# Patient Record
Sex: Female | Born: 1965 | Race: Black or African American | Hispanic: No | Marital: Single | Smoking: Current every day smoker
Health system: Southern US, Community
[De-identification: ages and names within clinical notes are randomized; demographics above are authoritative.]

## PROBLEM LIST (undated history)

## (undated) DIAGNOSIS — E7800 Pure hypercholesterolemia, unspecified: Secondary | ICD-10-CM

## (undated) HISTORY — PX: TUBAL LIGATION: SHX77

## (undated) HISTORY — DX: Pure hypercholesterolemia, unspecified: E78.00

---

## 1998-01-10 ENCOUNTER — Emergency Department (HOSPITAL_COMMUNITY): Admission: EM | Admit: 1998-01-10 | Discharge: 1998-01-10 | Payer: Self-pay | Admitting: Emergency Medicine

## 1998-01-25 ENCOUNTER — Emergency Department (HOSPITAL_COMMUNITY): Admission: EM | Admit: 1998-01-25 | Discharge: 1998-01-25 | Payer: Self-pay | Admitting: Emergency Medicine

## 1999-09-07 ENCOUNTER — Emergency Department (HOSPITAL_COMMUNITY): Admission: EM | Admit: 1999-09-07 | Discharge: 1999-09-07 | Payer: Self-pay | Admitting: Emergency Medicine

## 2000-03-19 ENCOUNTER — Emergency Department (HOSPITAL_COMMUNITY): Admission: EM | Admit: 2000-03-19 | Discharge: 2000-03-19 | Payer: Self-pay

## 2000-10-24 ENCOUNTER — Emergency Department (HOSPITAL_COMMUNITY): Admission: EM | Admit: 2000-10-24 | Discharge: 2000-10-24 | Payer: Self-pay | Admitting: Emergency Medicine

## 2000-10-24 ENCOUNTER — Encounter: Payer: Self-pay | Admitting: Emergency Medicine

## 2000-11-14 ENCOUNTER — Encounter: Payer: Self-pay | Admitting: *Deleted

## 2000-11-14 ENCOUNTER — Emergency Department (HOSPITAL_COMMUNITY): Admission: EM | Admit: 2000-11-14 | Discharge: 2000-11-14 | Payer: Self-pay | Admitting: *Deleted

## 2002-07-28 ENCOUNTER — Emergency Department (HOSPITAL_COMMUNITY): Admission: EM | Admit: 2002-07-28 | Discharge: 2002-07-28 | Payer: Self-pay | Admitting: Emergency Medicine

## 2003-10-05 ENCOUNTER — Ambulatory Visit (HOSPITAL_COMMUNITY): Admission: RE | Admit: 2003-10-05 | Discharge: 2003-10-05 | Payer: Self-pay | Admitting: Family Medicine

## 2004-03-08 ENCOUNTER — Emergency Department (HOSPITAL_COMMUNITY): Admission: EM | Admit: 2004-03-08 | Discharge: 2004-03-08 | Payer: Self-pay | Admitting: Emergency Medicine

## 2004-12-27 ENCOUNTER — Emergency Department (HOSPITAL_COMMUNITY): Admission: EM | Admit: 2004-12-27 | Discharge: 2004-12-27 | Payer: Self-pay | Admitting: Emergency Medicine

## 2007-06-15 ENCOUNTER — Emergency Department (HOSPITAL_COMMUNITY): Admission: EM | Admit: 2007-06-15 | Discharge: 2007-06-15 | Payer: Self-pay | Admitting: Family Medicine

## 2010-09-20 ENCOUNTER — Emergency Department (HOSPITAL_COMMUNITY)
Admission: EM | Admit: 2010-09-20 | Discharge: 2010-09-20 | Discharge status: Against Medical Advice | Payer: Self-pay | Attending: Emergency Medicine | Admitting: Emergency Medicine

## 2010-09-20 DIAGNOSIS — R079 Chest pain, unspecified: Secondary | ICD-10-CM | POA: Insufficient documentation

## 2011-10-17 ENCOUNTER — Encounter (HOSPITAL_COMMUNITY): Payer: Self-pay | Admitting: *Deleted

## 2011-10-17 ENCOUNTER — Other Ambulatory Visit: Payer: Self-pay

## 2011-10-17 ENCOUNTER — Emergency Department (HOSPITAL_COMMUNITY)
Admission: EM | Admit: 2011-10-17 | Discharge: 2011-10-17 | Disposition: A | Discharge status: Home / Self Care | Payer: Medicaid Other | Attending: Emergency Medicine | Admitting: Emergency Medicine

## 2011-10-17 ENCOUNTER — Emergency Department (HOSPITAL_COMMUNITY): Payer: Medicaid Other

## 2011-10-17 DIAGNOSIS — F17200 Nicotine dependence, unspecified, uncomplicated: Secondary | ICD-10-CM | POA: Insufficient documentation

## 2011-10-17 DIAGNOSIS — R0789 Other chest pain: Secondary | ICD-10-CM

## 2011-10-17 DIAGNOSIS — M940 Chondrocostal junction syndrome [Tietze]: Secondary | ICD-10-CM | POA: Insufficient documentation

## 2011-10-17 DIAGNOSIS — R071 Chest pain on breathing: Secondary | ICD-10-CM | POA: Insufficient documentation

## 2011-10-17 LAB — BASIC METABOLIC PANEL
Calcium: 9.1 mg/dL (ref 8.4–10.5)
Chloride: 100 mEq/L (ref 96–112)
Creatinine, Ser: 0.65 mg/dL (ref 0.50–1.10)
Glucose, Bld: 118 mg/dL — ABNORMAL HIGH (ref 70–99)
Potassium: 3.9 mEq/L (ref 3.5–5.1)
Sodium: 134 mEq/L — ABNORMAL LOW (ref 135–145)

## 2011-10-17 LAB — DIFFERENTIAL
Basophils Relative: 1 % (ref 0–1)
Eosinophils Absolute: 0.1 10*3/uL (ref 0.0–0.7)
Lymphocytes Relative: 44 % (ref 12–46)
Monocytes Absolute: 0.5 10*3/uL (ref 0.1–1.0)

## 2011-10-17 LAB — CBC
HCT: 31.6 % — ABNORMAL LOW (ref 36.0–46.0)
Hemoglobin: 9.7 g/dL — ABNORMAL LOW (ref 12.0–15.0)
Platelets: 296 10*3/uL (ref 150–400)
RBC: 4.63 MIL/uL (ref 3.87–5.11)
RDW: 18.4 % — ABNORMAL HIGH (ref 11.5–15.5)
WBC: 7.3 10*3/uL (ref 4.0–10.5)

## 2011-10-17 LAB — POCT I-STAT TROPONIN I: Troponin i, poc: 0 ng/mL (ref 0.00–0.08)

## 2011-10-17 MED ORDER — IBUPROFEN 600 MG PO TABS: 600.0000 mg | ORAL_TABLET | Freq: Three times a day (TID) | ORAL | Status: AC | PRN

## 2011-10-17 MED ORDER — ASPIRIN 81 MG PO CHEW
324.0000 mg | CHEWABLE_TABLET | Freq: Once | ORAL | Status: AC
  Administered 2011-10-17: 324 mg via ORAL
  Filled 2011-10-17: qty 4

## 2011-10-17 NOTE — ED Notes (Signed)
Patient is AOx4 and comfortable with her discharge instructions. 

## 2011-10-17 NOTE — Discharge Instructions (Signed)
Chest Pain (Nonspecific) It is often hard to give a specific diagnosis for the cause of chest pain. There is always a chance that your pain could be related to something serious, such as a heart attack or a blood clot in the lungs. You need to follow up with your caregiver for further evaluation. CAUSES   Heartburn.   Pneumonia or bronchitis.   Anxiety or stress.   Inflammation around your heart (pericarditis) or lung (pleuritis or pleurisy).   A blood clot in the lung.   A collapsed lung (pneumothorax). It can develop suddenly on its own (spontaneous pneumothorax) or from injury (trauma) to the chest.   Shingles infection (herpes zoster virus).  The chest wall is composed of bones, muscles, and cartilage. Any of these can be the source of the pain.  The bones can be bruised by injury.   The muscles or cartilage can be strained by coughing or overwork.   The cartilage can be affected by inflammation and become sore (costochondritis).  DIAGNOSIS  Lab tests or other studies, such as X-rays, electrocardiography, stress testing, or cardiac imaging, may be needed to find the cause of your pain.  TREATMENT   Treatment depends on what may be causing your chest pain. Treatment may include:   Acid blockers for heartburn.   Anti-inflammatory medicine.   Pain medicine for inflammatory conditions.   Antibiotics if an infection is present.   You may be advised to change lifestyle habits. This includes stopping smoking and avoiding alcohol, caffeine, and chocolate.   You may be advised to keep your head raised (elevated) when sleeping. This reduces the chance of acid going backward from your stomach into your esophagus.   Most of the time, nonspecific chest pain will improve within 2 to 3 days with rest and mild pain medicine.  HOME CARE INSTRUCTIONS   If antibiotics were prescribed, take your antibiotics as directed. Finish them even if you start to feel better.   For the next few  days, avoid physical activities that bring on chest pain. Continue physical activities as directed.   Do not smoke.   Avoid drinking alcohol.   Only take over-the-counter or prescription medicine for pain, discomfort, or fever as directed by your caregiver.   Follow your caregiver's suggestions for further testing if your chest pain does not go away.   Keep any follow-up appointments you made. If you do not go to an appointment, you could develop lasting (chronic) problems with pain. If there is any problem keeping an appointment, you must call to reschedule.  SEEK MEDICAL CARE IF:   You think you are having problems from the medicine you are taking. Read your medicine instructions carefully.   Your chest pain does not go away, even after treatment.   You develop a rash with blisters on your chest.  SEEK IMMEDIATE MEDICAL CARE IF:   You have increased chest pain or pain that spreads to your arm, neck, jaw, back, or abdomen.   You develop shortness of breath, an increasing cough, or you are coughing up blood.   You have severe back or abdominal pain, feel nauseous, or vomit.   You develop severe weakness, fainting, or chills.   You have a fever.  THIS IS AN EMERGENCY. Do not wait to see if the pain will go away. Get medical help at once. Call your local emergency services (911 in [place] Do not drive yourself to the hospital. MAKE SURE YOU:   Understand these instructions.     Will watch your condition.   Will get help right away if you are not doing well or get worse.  Document Released: 04/23/2005 Document Revised: 07/03/2011 Document Reviewed: 02/17/2008 [facility] Patient Information 2012 [place], [place].Chest Wall Pain Chest wall pain is pain in or around the bones and muscles of your chest. It may take up to 6 weeks to get better. It may take longer if you must stay physically active in your work and activities.  CAUSES  Chest wall pain may happen on its own. However, it may  be caused by:  A viral illness like the flu.   Injury.   Coughing.   Exercise.   Arthritis.   Fibromyalgia.   Shingles.  HOME CARE INSTRUCTIONS   Avoid overtiring physical activity. Try not to strain or perform activities that cause pain. This includes any activities using your chest or your abdominal and side muscles, especially if heavy weights are used.   Put ice on the sore area.   Put ice in a plastic bag.   Place a towel between your skin and the bag.   Leave the ice on for 15 to 20 minutes per hour while awake for the first 2 days.   Only take over-the-counter or prescription medicines for pain, discomfort, or fever as directed by your caregiver.  SEEK IMMEDIATE MEDICAL CARE IF:   Your pain increases, or you are very uncomfortable.   You have a fever.   Your chest pain becomes worse.   You have new, unexplained symptoms.   You have nausea or vomiting.   You feel sweaty or lightheaded.   You have a cough with phlegm (sputum), or you cough up blood.  MAKE SURE YOU:   Understand these instructions.   Will watch your condition.   Will get help right away if you are not doing well or get worse.  Document Released: 07/14/2005 Document Revised: 07/03/2011 Document Reviewed: 03/10/2011 [facility] Patient Information 2012 [place], [place].Costochondritis Costochondritis (Tietze syndrome), or costochondral separation, is a swelling and irritation (inflammation) of the tissue (cartilage) that connects your ribs with your breastbone (sternum). It may occur on its own (spontaneously), through damage caused by an accident (trauma), or simply from coughing or minor exercise. It may take up to 6 weeks to get better and longer if you are unable to be conservative in your activities. HOME CARE INSTRUCTIONS   Avoid exhausting physical activity. Try not to strain your ribs during normal activity. This would include any activities using chest, belly (abdominal), and side  muscles, especially if heavy weights are used.   Use ice for 15 to 20 minutes per hour while awake for the first 2 days. Place the ice in a plastic bag, and place a towel between the bag of ice and your skin.   Only take over-the-counter or prescription medicines for pain, discomfort, or fever as directed by your caregiver.  SEEK IMMEDIATE MEDICAL CARE IF:   Your pain increases or you are very uncomfortable.   You have a fever.   You develop difficulty with your breathing.   You cough up blood.   You develop worse chest pains, shortness of breath, sweating, or vomiting.   You develop new, unexplained problems (symptoms).  MAKE SURE YOU:   Understand these instructions.   Will watch your condition.   Will get help right away if you are not doing well or get worse.  Document Released: 04/23/2005 Document Revised: 07/03/2011 Document Reviewed: 03/01/2008 [facility] Patient Information 2012 [place], [place].

## 2011-10-17 NOTE — ED Notes (Signed)
Per EMS - pt from home, c/o left-sided chest pain x2 days, pain increased w/ palpation and movement. EKG unremarkable for EMS - denies shortness of breath, n/v, or dizziness.

## 2011-10-19 NOTE — ED Provider Notes (Signed)
History     CSN: [id number]  Arrival date & time 10/17/11  0030   First MD Initiated Contact with Patient 10/17/11 0123      Chief Complaint  Patient presents with  . Pleurisy    (Consider location/radiation/quality/duration/timing/severity/associated sxs/prior treatment) Patient is a 46 y.o. female presenting with chest pain. The history is provided by the patient.  Chest Pain The chest pain began 2 days ago. Duration of episode(s) is 2 seconds. Chest pain occurs intermittently. The chest pain is resolved. The pain is associated with breathing and coughing (movement through the chest wall, palpation of the chest wall). The severity of the pain is moderate. The quality of the pain is described as aching and sharp (localized to the left costochondral joints and chest wall). The pain does not radiate. Chest pain is worsened by deep breathing (movement through the chest wall, palpation). Pertinent negatives for primary symptoms include no fever, no fatigue, no syncope, no shortness of breath, no cough, no wheezing, no palpitations, no abdominal pain, no nausea, no vomiting, no dizziness and no altered mental status.  Pertinent negatives for associated symptoms include no claudication, no diaphoresis, no lower extremity edema, no near-syncope, no numbness, no orthopnea, no paroxysmal nocturnal dyspnea and no weakness. She tried NSAIDs for the symptoms. Risk factors include no known risk factors.     History reviewed. No pertinent past medical history.  No past surgical history on file.  No family history on file.  History  Substance Use Topics  . Smoking status: Current Everyday Smoker  . Smokeless tobacco: Not on file  . Alcohol Use:     OB History    Grav Para Term Preterm Abortions TAB SAB Ect Mult Living                  Review of Systems  Constitutional: Negative for fever, chills, diaphoresis, activity change, appetite change and fatigue.  HENT: Negative for congestion,  facial swelling, rhinorrhea, neck pain, neck stiffness and postnasal drip.   Eyes: Negative.   Respiratory: Negative for cough, choking, chest tightness, shortness of breath and wheezing.        Dyspnea on exertion  Cardiovascular: Positive for chest pain. Negative for palpitations, orthopnea, claudication, leg swelling, syncope and near-syncope.  Gastrointestinal: Negative for nausea, vomiting, abdominal pain and abdominal distention.  Musculoskeletal: Negative.   Skin: Negative.   Neurological: Negative for dizziness, syncope, speech difficulty, weakness, light-headedness, numbness and headaches.  Hematological: Does not bruise/bleed easily.  Psychiatric/Behavioral: Negative.  Negative for altered mental status.    Allergies  Review of patient's allergies indicates no known allergies.  Home Medications   Current Outpatient Rx  Name Route Sig Dispense Refill  . IBUPROFEN 200 MG PO TABS Oral Take 400 mg by mouth every 6 (six) hours as needed. For pain    . IBUPROFEN 600 MG PO TABS Oral Take 1 tablet (600 mg total) by mouth every 8 (eight) hours as needed for pain. 20 tablet 0    BP 120/75  Pulse 92  Temp(Src) 98.1 F (36.7 C) (Oral)  Resp 18  Ht 5\' 4"  (1.626 m)  Wt 120 lb (54.432 kg)  BMI 20.60 kg/m2  SpO2 100%  Physical Exam  Nursing note and vitals reviewed. Constitutional: She is oriented to person, place, and time. She appears well-nourished. No distress.  HENT:  Head: Normocephalic and atraumatic.  Mouth/Throat: Oropharynx is clear and moist.  Eyes: EOM are normal. Pupils are equal, round, and reactive to light.  Neck: Normal range of motion. Neck supple. No JVD present. No tracheal deviation present.  Cardiovascular: Normal rate, regular rhythm, S1 normal, S2 normal, normal heart sounds and intact distal pulses.   No extrasystoles are present. PMI is not displaced.  Exam reveals no gallop and no friction rub.   No murmur heard. Pulmonary/Chest: Effort normal and  breath sounds normal. No accessory muscle usage or stridor. Not tachypneic. No respiratory distress. She has no decreased breath sounds. She has no wheezes. She has no rhonchi. She has no rales. She exhibits tenderness and bony tenderness. She exhibits no crepitus and no retraction.       Pain reproduced with palpation of the left constochondral joints and chest wall  Abdominal: Soft. Bowel sounds are normal. She exhibits no distension and no mass. There is no tenderness. There is no rebound and no guarding.  Musculoskeletal: Normal range of motion. She exhibits no edema and no tenderness.  Neurological: She is alert and oriented to person, place, and time. No cranial nerve deficit. She exhibits normal muscle tone.  Skin: Skin is warm and dry. No rash noted. She is not diaphoretic. No erythema. No pallor.  Psychiatric: She has a normal mood and affect. Her behavior is normal. Judgment and thought content normal.    ED Course  Procedures (including critical care time)   Date: 10/19/2011  Rate: 103  Rhythm: sinus tachycardia  QRS Axis: normal  Intervals: normal  ST/T Wave abnormalities: nonspecific T wave changes  Conduction Disutrbances:none  Narrative Interpretation: anterior t wave inversions without prior comparison available  Old EKG Reviewed: none available  While the above ECG could be concerning in the setting of symptoms typical for myocardial ischemia, or with risk factors for CAD, or with prior ECG comparison showing these to be new changes, in this patient the findings are non-specific  Labs Reviewed  CBC - Abnormal; Notable for the following:    Hemoglobin 9.7 (*)    HCT 31.6 (*)    MCV 68.3 (*)    MCH 21.0 (*)    RDW 18.4 (*)    All other components within normal limits  BASIC METABOLIC PANEL - Abnormal; Notable for the following:    Sodium 134 (*)    Glucose, Bld 118 (*)    BUN 5 (*)    All other components within normal limits  DIFFERENTIAL  POCT I-STAT TROPONIN I    LAB REPORT - SCANNED   No results found.   1. Chest wall pain   2. Costochondritis       MDM  The patient's symptoms, history, and physical examination strongly suggest costochondritis and musculoskeletal chest wall pain.  MI, ACS, UA, CAD, GERD, Gastrointestinal Chest Pain, Pleuritic Chest Pain, Pneumonia, Pneumothorax, Pulmonary Embolism, Esophageal Spasm, Arrhythmia considered among other potential etiologies in the patient's differential diagnosis but thought less likely.         Felisa Bonier, MD 10/19/11 [phone]

## 2012-07-08 ENCOUNTER — Encounter (HOSPITAL_COMMUNITY): Payer: Self-pay | Admitting: Emergency Medicine

## 2012-07-08 ENCOUNTER — Emergency Department (HOSPITAL_COMMUNITY)
Admission: EM | Admit: 2012-07-08 | Discharge: 2012-07-08 | Disposition: A | Discharge status: Home / Self Care | Payer: Medicaid Other | Attending: Emergency Medicine | Admitting: Emergency Medicine

## 2012-07-08 DIAGNOSIS — R109 Unspecified abdominal pain: Secondary | ICD-10-CM | POA: Insufficient documentation

## 2012-07-08 DIAGNOSIS — N898 Other specified noninflammatory disorders of vagina: Secondary | ICD-10-CM | POA: Insufficient documentation

## 2012-07-08 DIAGNOSIS — F17200 Nicotine dependence, unspecified, uncomplicated: Secondary | ICD-10-CM | POA: Insufficient documentation

## 2012-07-08 LAB — URINALYSIS, ROUTINE W REFLEX MICROSCOPIC
Nitrite: NEGATIVE
pH: 5 (ref 5.0–8.0)

## 2012-07-08 LAB — WET PREP, GENITAL
Clue Cells Wet Prep HPF POC: NONE SEEN
WBC, Wet Prep HPF POC: NONE SEEN
Yeast Wet Prep HPF POC: NONE SEEN

## 2012-07-08 LAB — HIV ANTIBODY (ROUTINE TESTING W REFLEX): HIV: NONREACTIVE

## 2012-07-08 NOTE — ED Provider Notes (Signed)
History     CSN: [id number]  Arrival date & time 07/08/12  7829   First MD Initiated Contact with Patient 07/08/12 [phone]      Chief Complaint  Patient presents with  . Vaginitis    (Consider location/radiation/quality/duration/timing/severity/associated sxs/prior treatment) HPI  Katherine Werner is a 46 y.o. female c/o dysuria, foul smelling vaginal discharge, with suprapubic abdominal discomfort, cramping, 1/10 fleeting, itching to perineum x4 days., Denies fever, rash, urinary frequency, dyspareunia N/V, change in bowel habits.   History reviewed. No pertinent past medical history.  History reviewed. No pertinent past surgical history.  No family history on file.  History  Substance Use Topics  . Smoking status: Current Every Day Smoker  . Smokeless tobacco: Not on file  . Alcohol Use: Yes    OB History    Grav Para Term Preterm Abortions TAB SAB Ect Mult Living                  Review of Systems  Constitutional: Negative for fever.  Respiratory: Negative for shortness of breath.   Cardiovascular: Negative for chest pain.  Gastrointestinal: Negative for nausea, vomiting, abdominal pain and diarrhea.  Genitourinary: Positive for vaginal discharge.  All other systems reviewed and are negative.    Allergies  Review of patient's allergies indicates no known allergies.  Home Medications   Current Outpatient Rx  Name  Route  Sig  Dispense  Refill  . IBUPROFEN 200 MG PO TABS   Oral   Take 400 mg by mouth every 6 (six) hours as needed. For pain           BP 151/85  Pulse 78  Temp 98 F (36.7 C) (Oral)  Resp 18  Ht 5\' 4"  (1.626 m)  Wt 120 lb (54.432 kg)  BMI 20.60 kg/m2  SpO2 100%  Physical Exam  Nursing note and vitals reviewed. Constitutional: She is oriented to person, place, and time. She appears well-developed and well-nourished. No distress.  HENT:  Head: Normocephalic.  Mouth/Throat: Oropharynx is clear and moist.  Eyes: Conjunctivae normal  and EOM are normal. Pupils are equal, round, and reactive to light.  Cardiovascular: Normal rate, regular rhythm and intact distal pulses.   Pulmonary/Chest: Effort normal and breath sounds normal. No stridor. No respiratory distress. She has no wheezes. She has no rales. She exhibits no tenderness.  Abdominal: Soft. Bowel sounds are normal. She exhibits no distension and no mass. There is no tenderness. There is no rebound and no guarding.  Genitourinary:       Pelvic exam chaperoned by RN.  No rashes, Thin white discharge, no foul order, no cervical motion tenderness or adnexal tenderness.  Musculoskeletal: Normal range of motion.  Neurological: She is alert and oriented to person, place, and time.  Psychiatric: She has a normal mood and affect.    ED Course  Procedures (including critical care time)  Labs Reviewed  URINALYSIS, ROUTINE W REFLEX MICROSCOPIC - Abnormal; Notable for the following:    Color, Urine STRAW (*)     All other components within normal limits  WET PREP, GENITAL  RPR  GC/CHLAMYDIA PROBE AMP  HIV ANTIBODY (ROUTINE TESTING)   No results found.   1. Vaginal discharge       MDM  Physical exam shows no abnormalities, wet prep also normal urinalysis negative. Reassured patient. Konrad Dolores follow with [facility]  Discussed case with attending who agrees with plan and stability to d/c to home.    Pt  verbalized understanding and agrees with care plan. Outpatient follow-up and return precautions given.           Wynetta Emery, PA-C 07/08/12 1024

## 2012-07-08 NOTE — ED Provider Notes (Signed)
This was a supervised encounter with a mid-level provider.  I was available throughout the encounter the  Gerhard Munch, MD 07/08/12 1601

## 2012-07-08 NOTE — ED Notes (Signed)
C/o vaginal discharge with foul odor and abd cramping, denies V/D, dysuria, no fever, no meds pta, NAD

## 2012-07-09 LAB — GC/CHLAMYDIA PROBE AMP
CT Probe RNA: NEGATIVE
GC Probe RNA: NEGATIVE

## 2013-04-20 ENCOUNTER — Other Ambulatory Visit (HOSPITAL_COMMUNITY): Payer: Self-pay | Admitting: *Deleted

## 2013-04-20 DIAGNOSIS — Z1231 Encounter for screening mammogram for malignant neoplasm of breast: Secondary | ICD-10-CM

## 2013-05-10 ENCOUNTER — Ambulatory Visit (HOSPITAL_COMMUNITY)
Admission: RE | Admit: 2013-05-10 | Discharge: 2013-05-10 | Disposition: A | Discharge status: Home / Self Care | Payer: Medicaid Other | Source: Ambulatory Visit | Attending: *Deleted | Admitting: *Deleted

## 2013-05-10 DIAGNOSIS — Z1231 Encounter for screening mammogram for malignant neoplasm of breast: Secondary | ICD-10-CM

## 2013-05-17 ENCOUNTER — Other Ambulatory Visit: Payer: Self-pay | Admitting: *Deleted

## 2013-05-17 DIAGNOSIS — R928 Other abnormal and inconclusive findings on diagnostic imaging of breast: Secondary | ICD-10-CM

## 2013-06-01 ENCOUNTER — Ambulatory Visit
Admission: RE | Admit: 2013-06-01 | Discharge: 2013-06-01 | Disposition: A | Discharge status: Home / Self Care | Payer: Medicaid Other | Source: Ambulatory Visit | Attending: *Deleted | Admitting: *Deleted

## 2013-06-01 DIAGNOSIS — R928 Other abnormal and inconclusive findings on diagnostic imaging of breast: Secondary | ICD-10-CM

## 2013-12-27 ENCOUNTER — Other Ambulatory Visit: Payer: Self-pay | Admitting: Nurse Practitioner

## 2013-12-27 DIAGNOSIS — N63 Unspecified lump in unspecified breast: Secondary | ICD-10-CM

## 2014-01-09 ENCOUNTER — Ambulatory Visit
Admission: RE | Admit: 2014-01-09 | Discharge: 2014-01-09 | Disposition: A | Discharge status: Home / Self Care | Payer: Medicaid Other | Source: Ambulatory Visit | Attending: Nurse Practitioner | Admitting: Nurse Practitioner

## 2014-01-09 ENCOUNTER — Ambulatory Visit
Admission: RE | Admit: 2014-01-09 | Discharge: 2014-01-09 | Disposition: A | Discharge status: Home / Self Care | Payer: Self-pay | Source: Ambulatory Visit | Attending: Nurse Practitioner | Admitting: Nurse Practitioner

## 2014-01-09 DIAGNOSIS — N63 Unspecified lump in unspecified breast: Secondary | ICD-10-CM

## 2014-11-29 ENCOUNTER — Encounter (HOSPITAL_COMMUNITY): Payer: Self-pay | Admitting: Family Medicine

## 2014-11-29 ENCOUNTER — Emergency Department (HOSPITAL_COMMUNITY): Payer: Medicaid Other

## 2014-11-29 ENCOUNTER — Emergency Department (HOSPITAL_COMMUNITY)
Admission: EM | Admit: 2014-11-29 | Discharge: 2014-11-29 | Disposition: A | Discharge status: Home / Self Care | Payer: Medicaid Other | Attending: Emergency Medicine | Admitting: Emergency Medicine

## 2014-11-29 DIAGNOSIS — IMO0001 Reserved for inherently not codable concepts without codable children: Secondary | ICD-10-CM

## 2014-11-29 DIAGNOSIS — R141 Gas pain: Secondary | ICD-10-CM | POA: Insufficient documentation

## 2014-11-29 DIAGNOSIS — R109 Unspecified abdominal pain: Secondary | ICD-10-CM | POA: Insufficient documentation

## 2014-11-29 DIAGNOSIS — R140 Abdominal distension (gaseous): Secondary | ICD-10-CM | POA: Insufficient documentation

## 2014-11-29 DIAGNOSIS — M549 Dorsalgia, unspecified: Secondary | ICD-10-CM | POA: Insufficient documentation

## 2014-11-29 DIAGNOSIS — Z9851 Tubal ligation status: Secondary | ICD-10-CM | POA: Insufficient documentation

## 2014-11-29 DIAGNOSIS — Z3202 Encounter for pregnancy test, result negative: Secondary | ICD-10-CM | POA: Insufficient documentation

## 2014-11-29 DIAGNOSIS — Z720 Tobacco use: Secondary | ICD-10-CM | POA: Insufficient documentation

## 2014-11-29 LAB — URINALYSIS, ROUTINE W REFLEX MICROSCOPIC
Bilirubin Urine: NEGATIVE
GLUCOSE, UA: NEGATIVE mg/dL
Hgb urine dipstick: NEGATIVE
KETONES UR: NEGATIVE mg/dL
LEUKOCYTES UA: NEGATIVE
Nitrite: NEGATIVE
Protein, ur: NEGATIVE mg/dL
Specific Gravity, Urine: <1.005 — ABNORMAL LOW (ref 1.005–1.030)
Urobilinogen, UA: 0.2 mg/dL (ref 0.0–1.0)
pH: 6 (ref 5.0–8.0)

## 2014-11-29 LAB — POC URINE PREG, ED: Preg Test, Ur: NEGATIVE

## 2014-11-29 MED ORDER — DICYCLOMINE HCL 20 MG PO TABS: 20.0000 mg | ORAL_TABLET | Freq: Two times a day (BID) | ORAL | Status: DC

## 2014-11-29 MED ORDER — DICYCLOMINE HCL 10 MG/ML IM SOLN
20.0000 mg | Freq: Once | INTRAMUSCULAR | Status: AC
  Administered 2014-11-29: 20 mg via INTRAMUSCULAR
  Filled 2014-11-29: qty 2

## 2014-11-29 MED ORDER — OMEPRAZOLE 20 MG PO CPDR: 20.0000 mg | DELAYED_RELEASE_CAPSULE | Freq: Every day | ORAL | Status: DC

## 2014-11-29 MED ORDER — GI COCKTAIL ~~LOC~~
30.0000 mL | Freq: Once | ORAL | Status: AC
  Administered 2014-11-29: 30 mL via ORAL
  Filled 2014-11-29: qty 30

## 2014-11-29 MED ORDER — KETOROLAC TROMETHAMINE 60 MG/2ML IM SOLN
60.0000 mg | Freq: Once | INTRAMUSCULAR | Status: AC
  Administered 2014-11-29: 60 mg via INTRAMUSCULAR
  Filled 2014-11-29: qty 2

## 2014-11-29 MED ORDER — MELOXICAM 7.5 MG PO TABS: 7.5000 mg | ORAL_TABLET | Freq: Every day | ORAL | Status: DC

## 2014-11-29 NOTE — ED Notes (Signed)
Pt here for 2 to 3 days of abd pain, headaches, and other multiple complaints.

## 2014-11-29 NOTE — ED Provider Notes (Signed)
CSN: [id number]     Arrival date & time 11/29/14  0251 History  This chart was scribed for Benen Weida, MD by Annye AsaAnna Dorsett, ED Scribe. This patient was seen in room A10C/A10C and the patient's care was started at 3:35 AM.    Chief Complaint  Patient presents with  . Abdominal Pain   Patient is a 49 y.o. female presenting with abdominal pain. The history is provided by the patient. No language interpreter was used.  Abdominal Pain Pain location:  Generalized Pain quality: bloating   Pain radiates to:  Does not radiate Pain severity:  Moderate Onset quality:  Gradual Duration:  3 days Timing:  Constant Progression:  Unchanged Chronicity:  New Relieved by:  None tried Worsened by:  Nothing tried Ineffective treatments:  None tried Associated symptoms: no chest pain, no constipation, no cough, no diarrhea, no dysuria, no fever, no hematuria, no nausea and no vomiting   Risk factors: not pregnant     HPI Comments: Katherine Werner is an otherwise healthy 49 y.o. female who presents to the Emergency Department complaining of 2-3 days of back pain, abdominal pain and distension. Patient also reports limited range of motion to her right arm. She reports her last BM was this morning. She denies any further complaints at this time.   Patient has a PCP but is unable to recall his or her name at present.   History reviewed. No pertinent past medical history. Past Surgical History  Procedure Laterality Date  . Tubal ligation     History reviewed. No pertinent family history. History  Substance Use Topics  . Smoking status: Current Every Day Smoker  . Smokeless tobacco: Not on file  . Alcohol Use: Yes   OB History    No data available     Review of Systems  Constitutional: Negative for fever.  Respiratory: Negative for cough.   Cardiovascular: Negative for chest pain.  Gastrointestinal: Positive for abdominal pain and abdominal distention. Negative for nausea, vomiting, diarrhea and  constipation.  Genitourinary: Negative for dysuria and hematuria.  Musculoskeletal: Positive for back pain.  All other systems reviewed and are negative.   Allergies  Review of patient's allergies indicates no known allergies.  Home Medications   Prior to Admission medications   Medication Sig Start Date End Date Taking? Authorizing Provider  ibuprofen (ADVIL,MOTRIN) 200 MG tablet Take 400 mg by mouth every 6 (six) hours as needed. For pain    Historical Provider, MD   BP 138/79 mmHg  Pulse 73  Temp(Src) 97.7 F (36.5 C) (Oral)  Resp 19  Ht 5\' 2"  (1.575 m)  Wt 132 lb (59.875 kg)  BMI 24.14 kg/m2  SpO2 100%  LMP 04/11/2013 Physical Exam  Constitutional: She is oriented to person, place, and time. She appears well-developed and well-nourished. No distress.  HENT:  Head: Normocephalic and atraumatic.  Mouth/Throat: Oropharynx is clear and moist. No oropharyngeal exudate.  Moist mucous membranes  Eyes: EOM are normal. Pupils are equal, round, and reactive to light.  Neck: Normal range of motion. Neck supple. No JVD present.  No lymphnodes in the neck, no supraclavicular lymphnodes.   Cardiovascular: Normal rate, regular rhythm and normal heart sounds.  Exam reveals no gallop and no friction rub.   No murmur heard. Pulmonary/Chest: Effort normal and breath sounds normal. No respiratory distress. She has no wheezes. She has no rales.  Abdominal: Soft. Bowel sounds are normal. She exhibits no mass. There is no tenderness. There is no rebound  and no guarding.  Gassy  Musculoskeletal: Normal range of motion. She exhibits no edema.  Moves all extremities normally. No lymphnodes in the groin.   Lymphadenopathy:    She has no cervical adenopathy.  Neurological: She is alert and oriented to person, place, and time. She displays normal reflexes.  Skin: Skin is warm and dry. No rash noted.  Psychiatric: She has a normal mood and affect. Her behavior is normal.  Nursing note and vitals  reviewed.   ED Course  Procedures   DIAGNOSTIC STUDIES: Oxygen Saturation is 100% on RA, normal by my interpretation.    COORDINATION OF CARE: 3:40 AM Discussed treatment plan with pt at bedside and pt agreed to plan.   Labs Review Labs Reviewed  URINALYSIS, ROUTINE W REFLEX MICROSCOPIC  POC URINE PREG, ED    Imaging Review No results found.   EKG Interpretation None      MDM   Final diagnoses:  None   Gas on exam and associated cramping.  Exam and vitals are benign and reassuring.  No indication for labs or advanced imaging will treat symptomatically   I personally performed the services described in this documentation, which was scribed in my presence. The recorded information has been reviewed and is accurate.       Cy BlamerApril Manvi Guilliams, MD 11/29/14 [phone]

## 2014-11-29 NOTE — ED Notes (Signed)
Patient transported to X-ray 

## 2014-11-29 NOTE — ED Notes (Signed)
Pt asking for ginger ale

## 2016-06-24 ENCOUNTER — Telehealth (HOSPITAL_COMMUNITY): Payer: Self-pay | Admitting: *Deleted

## 2016-06-24 NOTE — Telephone Encounter (Signed)
Attempted to call patient to schedule appointment with [facility]. Left message for patient to call me back.

## 2016-08-27 ENCOUNTER — Emergency Department (HOSPITAL_COMMUNITY): Payer: Self-pay

## 2016-08-27 ENCOUNTER — Encounter (HOSPITAL_COMMUNITY): Payer: Self-pay

## 2016-08-27 ENCOUNTER — Emergency Department (HOSPITAL_COMMUNITY)
Admission: EM | Admit: 2016-08-27 | Discharge: 2016-08-27 | Disposition: A | Discharge status: Home / Self Care | Payer: Self-pay | Attending: Emergency Medicine | Admitting: Emergency Medicine

## 2016-08-27 DIAGNOSIS — W010XXA Fall on same level from slipping, tripping and stumbling without subsequent striking against object, initial encounter: Secondary | ICD-10-CM | POA: Insufficient documentation

## 2016-08-27 DIAGNOSIS — Z23 Encounter for immunization: Secondary | ICD-10-CM | POA: Insufficient documentation

## 2016-08-27 DIAGNOSIS — Y92009 Unspecified place in unspecified non-institutional (private) residence as the place of occurrence of the external cause: Secondary | ICD-10-CM | POA: Insufficient documentation

## 2016-08-27 DIAGNOSIS — Y999 Unspecified external cause status: Secondary | ICD-10-CM | POA: Insufficient documentation

## 2016-08-27 DIAGNOSIS — F17200 Nicotine dependence, unspecified, uncomplicated: Secondary | ICD-10-CM | POA: Insufficient documentation

## 2016-08-27 DIAGNOSIS — S81812A Laceration without foreign body, left lower leg, initial encounter: Secondary | ICD-10-CM | POA: Insufficient documentation

## 2016-08-27 DIAGNOSIS — Y9389 Activity, other specified: Secondary | ICD-10-CM | POA: Insufficient documentation

## 2016-08-27 DIAGNOSIS — S8991XA Unspecified injury of right lower leg, initial encounter: Secondary | ICD-10-CM | POA: Insufficient documentation

## 2016-08-27 MED ORDER — LIDOCAINE HCL (PF) 1 % IJ SOLN
5.0000 mL | Freq: Once | INTRAMUSCULAR | Status: AC
  Administered 2016-08-27: 5 mL
  Filled 2016-08-27: qty 5

## 2016-08-27 MED ORDER — TRAMADOL HCL 50 MG PO TABS: 50.0000 mg | ORAL_TABLET | Freq: Four times a day (QID) | ORAL | 0 refills | Status: DC | PRN

## 2016-08-27 MED ORDER — TETANUS-DIPHTH-ACELL PERTUSSIS 5-2.5-18.5 LF-MCG/0.5 IM SUSP
0.5000 mL | Freq: Once | INTRAMUSCULAR | Status: AC
  Administered 2016-08-27: 0.5 mL via INTRAMUSCULAR
  Filled 2016-08-27: qty 0.5

## 2016-08-27 MED ORDER — DICLOFENAC SODIUM 50 MG PO TBEC: 50.0000 mg | DELAYED_RELEASE_TABLET | Freq: Two times a day (BID) | ORAL | 0 refills | Status: DC

## 2016-08-27 MED ORDER — HYDROCODONE-ACETAMINOPHEN 5-325 MG PO TABS
1.0000 | ORAL_TABLET | Freq: Once | ORAL | Status: AC
  Administered 2016-08-27: 1 via ORAL
  Filled 2016-08-27: qty 1

## 2016-08-27 NOTE — ED Triage Notes (Signed)
Pt states slipped and fell, struck L lower leg on railing. Pt with skin tear to L anterior lower leg. Pt ambulatory at triage, full ROM. Bleeding controlled at triage.

## 2016-08-27 NOTE — ED Notes (Signed)
PA at bedside.

## 2016-08-27 NOTE — Progress Notes (Signed)
Orthopedic Tech Progress Note Patient Details:  Katherine PufferDana M Werner 08/30/1965 [id number]  Ortho Devices Type of Ortho Device: Crutches, Knee Immobilizer Ortho Device/Splint Location: RLE Ortho Device/Splint Interventions: Ordered, Application   Jennye MoccasinHughes, Jory Welke Craig 08/27/2016, 6:40 PM

## 2016-08-27 NOTE — ED Provider Notes (Signed)
[facility]-EMERGENCY DEPT Provider Note   CSN: [id number] Arrival date & time: 08/27/16  1514  By signing my name below, I, Nelwyn Salisbury, attest that this documentation has been prepared under the direction and in the presence of non-physician practitioner, Kerrie Buffalo, NP. Electronically Signed: Nelwyn Salisbury, Scribe. 08/27/2016. 4:12 PM.  History   Chief Complaint Chief Complaint  Patient presents with  . Leg Injury   The history is provided by the patient. No language interpreter was used.  Laceration   The incident occurred 1 to 2 hours ago. The laceration is located on the left leg. The laceration is 6 cm in size. The laceration mechanism was a a blunt object. The pain is mild. The pain has been constant since onset. She reports no foreign bodies present. Her tetanus status is out of date.    HPI Comments:  Katherine Werner is an otherwise healthy 51 y.o. female who presents to the Emergency Department complaining of constant, unchanged left leg laceration she sustained earlier today. Pt states she was helping her daughter move laundry when she slipped, fell, and cut her leg. She reports associated right knee pain and swelling. Pt has not tried anything at home for her pain. She denies any weakness or numbness. Pt is not UTD on tetanus.   History reviewed. No pertinent past medical history.  There are no active problems to display for this patient.   Past Surgical History:  Procedure Laterality Date  . TUBAL LIGATION      OB History    No data available       Home Medications    Prior to Admission medications   Medication Sig Start Date End Date Taking? Authorizing Provider  cetirizine (ZYRTEC) 10 MG tablet Take 10 mg by mouth daily as needed for allergies.    Historical Provider, MD  diclofenac (VOLTAREN) 50 MG EC tablet Take 1 tablet (50 mg total) by mouth 2 (two) times daily. 08/27/16   Hope Orlene Och, NP  dicyclomine (BENTYL) 20 MG tablet Take 1 tablet (20 mg total) by mouth 2  (two) times daily. 11/29/14   April Palumbo, MD  ibuprofen (ADVIL,MOTRIN) 200 MG tablet Take 400 mg by mouth every 6 (six) hours as needed. For pain    Historical Provider, MD  meloxicam (MOBIC) 7.5 MG tablet Take 1 tablet (7.5 mg total) by mouth daily. 11/29/14   April Palumbo, MD  omeprazole (PRILOSEC) 20 MG capsule Take 1 capsule (20 mg total) by mouth daily. 11/29/14   April Palumbo, MD  traMADol (ULTRAM) 50 MG tablet Take 1 tablet (50 mg total) by mouth every 6 (six) hours as needed. 08/27/16   Hope Orlene Och, NP    Family History History reviewed. No pertinent family history.  Social History Social History  Substance Use Topics  . Smoking status: Current Every Day Smoker  . Smokeless tobacco: Never Used  . Alcohol use Yes     Allergies   Patient has no known allergies.   Review of Systems Review of Systems  Gastrointestinal: Negative for abdominal pain.  Musculoskeletal: Positive for arthralgias and joint swelling.  Skin: Positive for wound.  Neurological: Negative for weakness and numbness.     Physical Exam Updated Vital Signs BP 145/79 (BP Location: Right Arm)   Pulse 92   Temp 97.8 F (36.6 C) (Oral)   Resp 18   LMP 04/11/2013   SpO2 100%   Physical Exam  Constitutional: She is oriented to person, place, and time. She appears  well-developed and well-nourished. No distress.  HENT:  Head: Normocephalic and atraumatic.  Eyes: Conjunctivae are normal.  Neck: Neck supple.  Cardiovascular: Normal rate.   Pulmonary/Chest: Effort normal.  Musculoskeletal:       Right knee: She exhibits no ecchymosis, no deformity, no laceration, no erythema, normal alignment and normal patellar mobility. Decreased range of motion: due to pain. Swelling: mild. Tenderness found.       Left lower leg: She exhibits tenderness, bony tenderness and laceration.       Legs: She has tenderness on palpation to the lower leg. She has slight swelling to the right knee and tenderness with palpation  and ROM. Pedal pulses 2+.   Neurological: She is alert and oriented to person, place, and time.  Skin: Skin is warm and dry.  6cm laceration to the anterior left lower leg.   Psychiatric: She has a normal mood and affect.  Nursing note and vitals reviewed.    ED Treatments / Results  DIAGNOSTIC STUDIES:  Oxygen Saturation is 100% on RA, normal by my interpretation.    COORDINATION OF CARE:  4:16 PM Discussed treatment plan with pt at bedside which includes X-ray and laceration repair and pt agreed to plan.   Radiology Dg Tibia/fibula Left  Result Date: 08/27/2016 CLINICAL DATA:  Larey SeatFell today while moving furniture, landed on RIGHT knee, injured and lacerated lower anterior LEFT leg EXAM: LEFT TIBIA AND FIBULA - 2 VIEW COMPARISON:  None FINDINGS: Osseous demineralization. Knee and ankle joint alignments normal. No acute fracture, dislocation, or bone destruction. Soft tissues radiographically unremarkable. IMPRESSION: Normal exam. Electronically Signed   By: Ulyses SouthwardMark  Boles M.D.   On: 08/27/2016 17:52   Dg Knee Complete 4 Views Right  Result Date: 08/27/2016 CLINICAL DATA:  Anterior RIGHT knee pain after falling today while moving furniture, landed on RIGHT knee, injury lacerated lower anterior LEFT leg EXAM: RIGHT KNEE - COMPLETE 4+ VIEW COMPARISON:  None FINDINGS: Osseous mineralization appears mildly decreased diffusely. Joint spaces preserved. Age-indeterminate avulsion fracture identified at medial margin of medial femoral condyle, question reflecting MCL injury. No additional fracture, dislocation, or bone destruction. Sclerotic focus within proximal RIGHT tibial diaphysis question bone infarct or enchondroma. No definite knee joint effusion. IMPRESSION: Age-indeterminate avulsion fragment at the medial margin of the medial femoral condyle question MCL injury. No additional acute bony abnormalities. Question enchondroma versus bone infarct at proximal RIGHT tibial diaphysis. Electronically  Signed   By: Ulyses SouthwardMark  Boles M.D.   On: 08/27/2016 17:52    Procedures .Marland Kitchen.Laceration Repair Date/Time: 08/27/2016 5:14 PM Performed by: Janne NapoleonNEESE, HOPE M Authorized by: Janne NapoleonNEESE, HOPE M   Consent:    Consent obtained:  Verbal   Consent given by:  Patient   Risks discussed:  Infection and poor cosmetic result   Alternatives discussed:  No treatment Anesthesia (see MAR for exact dosages):    Anesthesia method:  Local infiltration   Local anesthetic:  Lidocaine 1% w/o epi Laceration details:    Location:  Leg   Leg location:  L lower leg   Length (cm):  6 Repair type:    Repair type:  Intermediate Exploration:    Hemostasis achieved with:  Direct pressure   Wound extent: no foreign bodies/material noted, no nerve damage noted, no tendon damage noted and no underlying fracture noted     Contaminated: no   Treatment:    Area cleansed with:  Betadine (cleaned with betadine scrub brush and irrigated with NSS prior to procedure)   Amount of cleaning:  Extensive   Irrigation solution:  Sterile saline   Irrigation method:  Syringe Subcutaneous repair:    Suture size:  4-0   Suture material:  Vicryl   Number of sutures:  2 Skin repair:    Repair method:  Sutures   Suture size:  4-0   Suture material:  Prolene   Suture technique:  Simple interrupted   Number of sutures:  8 Approximation:    Approximation:  Loose   Vermilion border: well-aligned   Post-procedure details:    Dressing:  Sterile dressing and non-adherent dressing   Patient tolerance of procedure:  Tolerated well, no immediate complications Comments:     Tetanus updated.    (including critical care time)  Medications Ordered in ED Medications  Tdap (BOOSTRIX) injection 0.5 mL (0.5 mLs Intramuscular Given 08/27/16 1632)  lidocaine (PF) (XYLOCAINE) 1 % injection 5 mL (5 mLs Infiltration Given 08/27/16 1632)  HYDROcodone-acetaminophen (NORCO/VICODIN) 5-325 MG per tablet 1 tablet (1 tablet Oral Given 08/27/16 1639)      Initial Impression / Assessment and Plan / ED Course  I have reviewed the triage vital signs and the nursing notes.  Pertinent imaging results that were available during my care of the patient were reviewed by me and considered in my medical decision making (see chart for details).     Final Clinical Impressions(s) / ED Diagnoses  51 y.o. female with laceration to the left lower leg and contusion to the right knee stable for d/c without focal neuro deficits. Discussed clinical and x-ray findings with the patient and need for f/u with ortho for her knee. She voices understanding and agrees with plan. Knee immobilizer, crutches, ice, elevation and pain management.  Final diagnoses:  Laceration of left lower leg, initial encounter  Right knee injury, initial encounter    New Prescriptions Discharge Medication List as of 08/27/2016  6:09 PM    START taking these medications   Details  diclofenac (VOLTAREN) 50 MG EC tablet Take 1 tablet (50 mg total) by mouth 2 (two) times daily., Starting Wed 08/27/2016, Print    traMADol (ULTRAM) 50 MG tablet Take 1 tablet (50 mg total) by mouth every 6 (six) hours as needed., Starting Wed 08/27/2016, Print      I personally performed the services described in this documentation, which was scribed in my presence. The recorded information has been reviewed and is accurate.     [place], NP 08/27/16 1847    Charlynne Pander, MD 08/31/16 1440

## 2016-08-27 NOTE — ED Notes (Signed)
Patient transported to X-ray 

## 2016-08-27 NOTE — Discharge Instructions (Signed)
Follow up with Dr. Lajoyce Cornersuda. Return here as needed.

## 2016-08-27 NOTE — ED Notes (Signed)
Waiting for ortho 

## 2016-09-10 ENCOUNTER — Other Ambulatory Visit (HOSPITAL_COMMUNITY): Payer: Self-pay | Admitting: *Deleted

## 2016-09-10 DIAGNOSIS — N6310 Unspecified lump in the right breast, unspecified quadrant: Secondary | ICD-10-CM

## 2016-09-10 DIAGNOSIS — N6320 Unspecified lump in the left breast, unspecified quadrant: Principal | ICD-10-CM

## 2016-09-11 ENCOUNTER — Other Ambulatory Visit: Payer: Medicaid Other

## 2016-09-11 ENCOUNTER — Ambulatory Visit (HOSPITAL_COMMUNITY): Payer: Medicaid Other

## 2016-10-09 ENCOUNTER — Encounter: Payer: Medicaid Other | Admitting: Family Medicine

## 2017-04-06 ENCOUNTER — Encounter (INDEPENDENT_AMBULATORY_CARE_PROVIDER_SITE_OTHER): Payer: Self-pay | Admitting: Physician Assistant

## 2017-04-06 ENCOUNTER — Ambulatory Visit (INDEPENDENT_AMBULATORY_CARE_PROVIDER_SITE_OTHER): Payer: Self-pay | Admitting: Physician Assistant

## 2017-04-06 VITALS — BP 129/76 | HR 93 | Temp 97.9°F | Resp 16 | Ht 64.0 in | Wt 144.8 lb

## 2017-04-06 DIAGNOSIS — L299 Pruritus, unspecified: Secondary | ICD-10-CM

## 2017-04-06 DIAGNOSIS — G4700 Insomnia, unspecified: Secondary | ICD-10-CM

## 2017-04-06 DIAGNOSIS — Z114 Encounter for screening for human immunodeficiency virus [HIV]: Secondary | ICD-10-CM

## 2017-04-06 DIAGNOSIS — Z1211 Encounter for screening for malignant neoplasm of colon: Secondary | ICD-10-CM

## 2017-04-06 DIAGNOSIS — Z23 Encounter for immunization: Secondary | ICD-10-CM

## 2017-04-06 DIAGNOSIS — Z1159 Encounter for screening for other viral diseases: Secondary | ICD-10-CM

## 2017-04-06 DIAGNOSIS — Z1239 Encounter for other screening for malignant neoplasm of breast: Secondary | ICD-10-CM

## 2017-04-06 DIAGNOSIS — Z0000 Encounter for general adult medical examination without abnormal findings: Secondary | ICD-10-CM

## 2017-04-06 DIAGNOSIS — Z1231 Encounter for screening mammogram for malignant neoplasm of breast: Secondary | ICD-10-CM

## 2017-04-06 LAB — POCT URINALYSIS DIPSTICK
Bilirubin, UA: NEGATIVE
GLUCOSE UA: NEGATIVE
Ketones, UA: NEGATIVE
Nitrite, UA: NEGATIVE
PROTEIN UA: NEGATIVE
RBC UA: NEGATIVE
Spec Grav, UA: 1.025 (ref 1.010–1.025)
UROBILINOGEN UA: 0.2 U/dL
pH, UA: 5.5 (ref 5.0–8.0)

## 2017-04-06 MED ORDER — HYDROXYZINE HCL 25 MG PO TABS: 25.0000 mg | ORAL_TABLET | Freq: Every day | ORAL | 0 refills | Status: DC

## 2017-04-06 NOTE — Patient Instructions (Signed)

## 2017-04-06 NOTE — Progress Notes (Signed)
Subjective:  Patient ID: Katherine Werner, female    DOB: 24-Jan-1966  Age: 51 y.o. MRN: [id number]  CC: full physical  HPI Katherine Werner is a 51 y.o. female with no significant medical history presents to establish care. Would like a full physical.  Says she has difficulty sleeping. Trouble with initiating and maintaining for approximately one year. Has poor sleep hygiene. Uses TV to go to sleep. Dozes off for 30 minutes and stays up the rest of the night. However, says she feels like she has slept more than just 30 minutes. Denies bipolar disorder and mood disorder. No recreational/illegal drug use. Smokes one cigarette a day. Drinks beer in the weekends on occasion. Drinks approximately two 40 oz beers. Also complains of generalized itching without a rash/lesions. Thinks she may have allergies. Has not taken antihistamines for relief. Denies CP, palpitations, SOB, HA, abdominal pain, f/c/n/v, swelling, or GI/GU sxs.          Outpatient Medications Prior to Visit  Medication Sig Dispense Refill  . cetirizine (ZYRTEC) 10 MG tablet Take 10 mg by mouth daily as needed for allergies.    Marland Kitchen diclofenac (VOLTAREN) 50 MG EC tablet Take 1 tablet (50 mg total) by mouth 2 (two) times daily. 15 tablet 0  . dicyclomine (BENTYL) 20 MG tablet Take 1 tablet (20 mg total) by mouth 2 (two) times daily. 20 tablet 0  . ibuprofen (ADVIL,MOTRIN) 200 MG tablet Take 400 mg by mouth every 6 (six) hours as needed. For pain    . meloxicam (MOBIC) 7.5 MG tablet Take 1 tablet (7.5 mg total) by mouth daily. 10 tablet 0  . omeprazole (PRILOSEC) 20 MG capsule Take 1 capsule (20 mg total) by mouth daily. 30 capsule 0  . traMADol (ULTRAM) 50 MG tablet Take 1 tablet (50 mg total) by mouth every 6 (six) hours as needed. 15 tablet 0   No facility-administered medications prior to visit.      ROS Review of Systems  Constitutional: Negative for chills, fever and malaise/fatigue.  Eyes: Negative for blurred vision.   Respiratory: Negative for shortness of breath.   Cardiovascular: Negative for chest pain and palpitations.  Gastrointestinal: Negative for abdominal pain and nausea.  Genitourinary: Negative for dysuria and hematuria.  Musculoskeletal: Negative for joint pain and myalgias.  Skin: Positive for itching. Negative for rash.  Neurological: Negative for tingling and headaches.  Psychiatric/Behavioral: Negative for depression. The patient has insomnia. The patient is not nervous/anxious.     Objective:  LMP 04/11/2013   BP/Weight 08/27/2016 11/29/2014 07/08/2012  Systolic BP 145 130 117  Diastolic BP 79 79 74  Wt. (Lbs) - 132 120  BMI - 24.14 20.59      Physical Exam  Constitutional: She is oriented to person, place, and time.  Well developed, centrally obese, NAD, polite  HENT:  Head: Normocephalic and atraumatic.  Mouth/Throat: No oropharyngeal exudate.  Eyes: Pupils are equal, round, and reactive to light. Conjunctivae and EOM are normal. No scleral icterus.  Neck: Normal range of motion. Neck supple. No thyromegaly present.  Cardiovascular: Normal rate, regular rhythm and normal heart sounds.   Pulmonary/Chest: Effort normal and breath sounds normal.  Abdominal: Soft. Bowel sounds are normal. There is no tenderness.  Genitourinary:  Genitourinary Comments: Pt declined  Musculoskeletal: She exhibits no edema.  Full aROM of LEs, UEs, and back.   Lymphadenopathy:    She has no cervical adenopathy.  Neurological: She is alert and oriented to person, place, and  time. No cranial nerve deficit. Coordination normal.  Normal gait. Strength 5/5 throughout.   Skin: Skin is warm and dry. No rash noted. No erythema. No pallor.  Psychiatric: She has a normal mood and affect. Her behavior is normal. Thought content normal.  Vitals reviewed.    Assessment & Plan:    1. Annual physical exam - CBC with Differential - Comprehensive metabolic panel - TSH - Lipid Panel - Urinalysis  Dipstick  2. Insomnia, unspecified type - Begin hydrOXYzine (ATARAX/VISTARIL) 25 MG tablet; Take 1 tablet (25 mg total) by mouth at bedtime.  Dispense: 30 tablet; Refill: 0  3. Pruritus - Begin hydrOXYzine (ATARAX/VISTARIL) 25 MG tablet; Take 1 tablet (25 mg total) by mouth at bedtime.  Dispense: 30 tablet; Refill: 0  4. Encounter for screening for HIV - HIV antibody  5. Need for hepatitis C screening test - Hepatitis c antibody (reflex)  6. Screening for breast cancer - [place] DIGITAL SCREENING BILATERAL; Future  7. Screen for colon cancer - Fecal occult blood, imunochemical  8. Need for Tdap vaccination - Tdap vaccine greater than or equal to 7yo IM  9. Influenza vaccine needed - Flu Vaccine QUAD 6+ mos PF IM (Fluarix Quad PF)   Meds ordered this encounter  Medications  . hydrOXYzine (ATARAX/VISTARIL) 25 MG tablet    Sig: Take 1 tablet (25 mg total) by mouth at bedtime.    Dispense:  30 tablet    Refill:  0    Order Specific Question:   Supervising Provider    Answer:   Quentin AngstJEGEDE, OLUGBEMIGA E [id number]]    Follow-up: Return in about 4 weeks (around 05/04/2017) for insomnia.   Loletta Specteroger David Arihanna Estabrook PA

## 2017-04-07 ENCOUNTER — Telehealth (INDEPENDENT_AMBULATORY_CARE_PROVIDER_SITE_OTHER): Payer: Self-pay | Admitting: *Deleted

## 2017-04-07 ENCOUNTER — Other Ambulatory Visit (INDEPENDENT_AMBULATORY_CARE_PROVIDER_SITE_OTHER): Payer: Self-pay | Admitting: Physician Assistant

## 2017-04-07 DIAGNOSIS — E781 Pure hyperglyceridemia: Secondary | ICD-10-CM

## 2017-04-07 DIAGNOSIS — E785 Hyperlipidemia, unspecified: Secondary | ICD-10-CM | POA: Insufficient documentation

## 2017-04-07 LAB — CBC WITH DIFFERENTIAL/PLATELET
BASOS: 1 %
Basophils Absolute: 0 10*3/uL (ref 0.0–0.2)
EOS (ABSOLUTE): 0.2 10*3/uL (ref 0.0–0.4)
Eos: 3 %
HEMATOCRIT: 37.5 % (ref 34.0–46.6)
HEMOGLOBIN: 12 g/dL (ref 11.1–15.9)
IMMATURE GRANS (ABS): 0 10*3/uL (ref 0.0–0.1)
Immature Granulocytes: 0 %
LYMPHS: 43 %
Lymphocytes Absolute: 2.5 10*3/uL (ref 0.7–3.1)
MCH: 24.8 pg — ABNORMAL LOW (ref 26.6–33.0)
MCHC: 32 g/dL (ref 31.5–35.7)
MCV: 78 fL — AB (ref 79–97)
MONOCYTES: 5 %
Monocytes Absolute: 0.3 10*3/uL (ref 0.1–0.9)
NEUTROS ABS: 2.8 10*3/uL (ref 1.4–7.0)
Neutrophils: 48 %
PLATELETS: 268 10*3/uL (ref 150–379)
RBC: 4.83 x10E6/uL (ref 3.77–5.28)
RDW: 17.3 % — AB (ref 12.3–15.4)
WBC: 5.8 10*3/uL (ref 3.4–10.8)

## 2017-04-07 LAB — HCV COMMENT:

## 2017-04-07 LAB — COMPREHENSIVE METABOLIC PANEL
ALK PHOS: 92 IU/L (ref 39–117)
ALT: 14 IU/L (ref 0–32)
AST: 30 IU/L (ref 0–40)
Albumin/Globulin Ratio: 0.9 — ABNORMAL LOW (ref 1.2–2.2)
Albumin: 4 g/dL (ref 3.5–5.5)
BUN/Creatinine Ratio: 8 — ABNORMAL LOW (ref 9–23)
BUN: 7 mg/dL (ref 6–24)
Bilirubin Total: <0.2 mg/dL (ref 0.0–1.2)
CALCIUM: 9.2 mg/dL (ref 8.7–10.2)
CO2: 22 mmol/L (ref 20–29)
CREATININE: 0.85 mg/dL (ref 0.57–1.00)
Chloride: 101 mmol/L (ref 96–106)
GFR calc Af Amer: 91 mL/min/{1.73_m2} (ref >59)
GFR, EST NON AFRICAN AMERICAN: 79 mL/min/{1.73_m2} (ref >59)
GLOBULIN, TOTAL: 4.5 g/dL (ref 1.5–4.5)
GLUCOSE: 89 mg/dL (ref 65–99)
Potassium: 4.5 mmol/L (ref 3.5–5.2)
SODIUM: 141 mmol/L (ref 134–144)
Total Protein: 8.5 g/dL (ref 6.0–8.5)

## 2017-04-07 LAB — HIV ANTIBODY (ROUTINE TESTING W REFLEX): HIV Screen 4th Generation wRfx: NONREACTIVE

## 2017-04-07 LAB — LIPID PANEL
CHOLESTEROL TOTAL: 227 mg/dL — AB (ref 100–199)
Chol/HDL Ratio: 3.7 ratio (ref 0.0–4.4)
HDL: 62 mg/dL (ref >39)
TRIGLYCERIDES: 513 mg/dL — AB (ref 0–149)

## 2017-04-07 LAB — HEPATITIS C ANTIBODY (REFLEX)

## 2017-04-07 LAB — TSH: TSH: 1.68 u[IU]/mL (ref 0.450–4.500)

## 2017-04-07 MED ORDER — GEMFIBROZIL 600 MG PO TABS: 600.0000 mg | ORAL_TABLET | Freq: Two times a day (BID) | ORAL | 2 refills | Status: DC

## 2017-04-07 NOTE — Telephone Encounter (Signed)
-----   Message from Loletta Specteroger David Gomez, PA-C sent at 04/07/2017  8:51 AM EDT ----- Triglycerides are very high, she is at risk of pancreatitis. [facility] [place]. Rest of labs normal/unremarkable.

## 2017-04-07 NOTE — Telephone Encounter (Signed)
Patient presented to the office and was advised of triglycerides being high and needing to begin the medication [facility] to [facility]. Patient is aware of all other labs being normal. No further questions at this time.

## 2017-04-08 LAB — FECAL OCCULT BLOOD, IMMUNOCHEMICAL: Fecal Occult Bld: NEGATIVE

## 2017-04-09 ENCOUNTER — Telehealth (INDEPENDENT_AMBULATORY_CARE_PROVIDER_SITE_OTHER): Payer: Self-pay

## 2017-04-09 NOTE — Telephone Encounter (Signed)
Patient is aware of negative fecal occult blood. Maryjean Mornempestt S Roberts, CMA

## 2017-04-09 NOTE — Telephone Encounter (Signed)
-----   Message from Loletta Specteroger David Gomez, PA-C sent at 04/09/2017  1:37 PM EDT ----- No blood in stool.

## 2017-05-04 ENCOUNTER — Ambulatory Visit (INDEPENDENT_AMBULATORY_CARE_PROVIDER_SITE_OTHER): Payer: Self-pay | Admitting: Physician Assistant

## 2017-09-06 ENCOUNTER — Emergency Department (HOSPITAL_COMMUNITY): Payer: Medicaid Other

## 2017-09-06 ENCOUNTER — Encounter (HOSPITAL_COMMUNITY): Payer: Self-pay

## 2017-09-06 ENCOUNTER — Emergency Department (HOSPITAL_COMMUNITY)
Admission: EM | Admit: 2017-09-06 | Discharge: 2017-09-06 | Disposition: A | Discharge status: Home / Self Care | Payer: Medicaid Other | Attending: Emergency Medicine | Admitting: Emergency Medicine

## 2017-09-06 DIAGNOSIS — R05 Cough: Secondary | ICD-10-CM | POA: Insufficient documentation

## 2017-09-06 DIAGNOSIS — F17210 Nicotine dependence, cigarettes, uncomplicated: Secondary | ICD-10-CM | POA: Insufficient documentation

## 2017-09-06 DIAGNOSIS — Z79899 Other long term (current) drug therapy: Secondary | ICD-10-CM | POA: Insufficient documentation

## 2017-09-06 DIAGNOSIS — J029 Acute pharyngitis, unspecified: Secondary | ICD-10-CM | POA: Insufficient documentation

## 2017-09-06 DIAGNOSIS — R059 Cough, unspecified: Secondary | ICD-10-CM

## 2017-09-06 LAB — RAPID STREP SCREEN (MED CTR MEBANE ONLY): Streptococcus, Group A Screen (Direct): NEGATIVE

## 2017-09-06 MED ORDER — PREDNISONE 10 MG (21) PO TBPK: ORAL_TABLET | ORAL | 0 refills | Status: DC

## 2017-09-06 MED ORDER — BENZONATATE 100 MG PO CAPS: 100.0000 mg | ORAL_CAPSULE | Freq: Three times a day (TID) | ORAL | 0 refills | Status: DC

## 2017-09-06 NOTE — Discharge Instructions (Signed)
There were no acute abnormalities on the chest x-ray, but there is some mild heart enlargement for which you should follow-up with your primary doctor.   Your rapid strep was negative. Your symptoms are consistent with a viral illness. Viruses do not require antibiotics. Treatment is symptomatic care and it is important to note that these symptoms may last for 7-14 days.   Hand washing: Wash your hands throughout the day, but especially before and after touching the face, using the restroom, sneezing, coughing, or touching surfaces that have been coughed or sneezed upon. Hydration: Symptoms will be intensified and complicated by dehydration. Dehydration can also extend the duration of symptoms. Drink plenty of fluids and get plenty of rest. You should be drinking at least half a liter of water an hour to stay hydrated. Electrolyte drinks (ex. Gatorade, Powerade, Pedialyte) are also encouraged. You should be drinking enough fluids to make your urine light yellow, almost clear. If this is not the case, you are not drinking enough water. Please note that some of the treatments indicated below will not be effective if you are not adequately hydrated. Diet: Please concentrate on hydration, however, you may introduce food slowly.  Start with a clear liquid diet, progressed to a full liquid diet, and then bland solids as you are able. Pain or fever: Ibuprofen, Naproxen, or Tylenol for pain or fever.  Cough: Use the Tessalon for cough.  Prednisone: Take the prednisone, as directed, in its entirety. Zyrtec or Claritin: May add these medication daily to control underlying symptoms of congestion, sneezing, and other signs of allergies. Flonase: Use this medication, as directed, for nasal and sinus congestion. Congestion: Plain Mucinex may help relieve congestion. Saline sinus rinses and saline nasal sprays may also help relieve congestion. If you do not have heart problems or an allergy to such medications, you  may also try phenylephrine or Sudafed. Sore throat: Warm liquids or Chloraseptic spray may help soothe a sore throat. Gargle twice a day with a salt water solution made from a half teaspoon of salt in a cup of warm water.  Follow up: Follow up with a primary care provider, as needed, for any future management of this issue.

## 2017-09-06 NOTE — ED Triage Notes (Signed)
Per Pt, Pt is coming from home with complaints of cough and sore throat x 1 week.

## 2017-09-06 NOTE — ED Provider Notes (Signed)
[facility] ([place] EMERGENCY DEPARTMENT Provider Note   CSN: [id number] Arrival date & time: 09/06/17  1308     History   Chief Complaint Chief Complaint  Patient presents with  . Cough  . Sore Throat    HPI Katherine Werner is a 52 y.o. female.  HPI   Katherine Werner is a 52 y.o. female, presenting to the ED with cough, congestion, and sore throat for the past 5 days.  Throat pain is bilateral, described as itching and burning, moderate, nonradiating. She has been taking Mucinex, but no other medications.  Denies fever/chills, shortness of breath, chest pain, difficulty swallowing, N/V/D, or any other complaints.   History reviewed. No pertinent past medical history.  Patient Active Problem List   Diagnosis Date Noted  . Hypertriglyceridemia 04/07/2017    Past Surgical History:  Procedure Laterality Date  . TUBAL LIGATION      OB History    No data available       Home Medications    Prior to Admission medications   Medication Sig Start Date End Date Taking? Authorizing Provider  benzonatate (TESSALON) 100 MG capsule Take 1 capsule (100 mg total) by mouth every 8 (eight) hours. 09/06/17   Joy, Shawn C, PA-C  cetirizine (ZYRTEC) 10 MG tablet Take 10 mg by mouth daily as needed for allergies.    [provider]  diclofenac (VOLTAREN) 50 MG EC tablet Take 1 tablet (50 mg total) by mouth 2 (two) times daily. 08/27/16   Janne Napoleon, NP  dicyclomine (BENTYL) 20 MG tablet Take 1 tablet (20 mg total) by mouth 2 (two) times daily. 11/29/14   Palumbo, April, MD  gemfibrozil (LOPID) 600 MG tablet Take 1 tablet (600 mg total) by mouth 2 (two) times daily before a meal. 04/07/17   Loletta Specter, PA-C  hydrOXYzine (ATARAX/VISTARIL) 25 MG tablet Take 1 tablet (25 mg total) by mouth at bedtime. 04/06/17   Loletta Specter, PA-C  ibuprofen (ADVIL,MOTRIN) 200 MG tablet Take 400 mg by mouth every 6 (six) hours as needed. For pain    [provider]    meloxicam (MOBIC) 7.5 MG tablet Take 1 tablet (7.5 mg total) by mouth daily. 11/29/14   Palumbo, April, MD  omeprazole (PRILOSEC) 20 MG capsule Take 1 capsule (20 mg total) by mouth daily. 11/29/14   Palumbo, April, MD  predniSONE (STERAPRED UNI-PAK 21 TAB) 10 MG (21) TBPK tablet Take 6 tabs (60mg ) on day 1, 5 tabs (50mg ) on day 2, 4 tabs (40mg ) on day 3, 3 tabs (30mg ) on day 4, 2 tabs (20mg ) on day 5, and 1 tab (10mg ) on day 6. 09/06/17   Joy, Shawn C, PA-C  traMADol (ULTRAM) 50 MG tablet Take 1 tablet (50 mg total) by mouth every 6 (six) hours as needed. 08/27/16   Janne Napoleon, NP    Family History No family history on file.  Social History Social History   Tobacco Use  . Smoking status: Current Every Day Smoker    Packs/day: 0.10    Types: Cigarettes  . Smokeless tobacco: Never Used  Substance Use Topics  . Alcohol use: Yes  . Drug use: Not on file     Allergies   Patient has no known allergies.   Review of Systems Review of Systems  Constitutional: Negative for fever.  HENT: Positive for congestion and sore throat. Negative for trouble swallowing.   Respiratory: Positive for cough. Negative for shortness of breath.  Cardiovascular: Negative for chest pain.  Gastrointestinal: Negative for abdominal pain, diarrhea, nausea and vomiting.  All other systems reviewed and are negative.    Physical Exam Updated Vital Signs BP 135/84 (BP Location: Right Arm)   Pulse 94   Temp 97.8 F (36.6 C) (Oral)   Resp 16   Ht 5\' 3"  (1.6 m)   Wt 63.5 kg (140 lb)   LMP 04/11/2013   SpO2 98%   BMI 24.80 kg/m   Physical Exam  Constitutional: She appears well-developed and well-nourished. No distress.  HENT:  Head: Normocephalic and atraumatic.  Patient handles oral secretions without difficulty.  No trismus.  Patient has mouth opening to at least 3 finger widths.  Eyes: Conjunctivae are normal.  Neck: Neck supple.  Cardiovascular: Normal rate, regular rhythm, normal heart sounds  and intact distal pulses.  Pulmonary/Chest: Effort normal and breath sounds normal. No respiratory distress.  No increased work of breathing.  Patient speaks in full sentences without difficulty.  Abdominal: Soft. There is no tenderness. There is no guarding.  Musculoskeletal: She exhibits no edema.  Lymphadenopathy:    She has no cervical adenopathy.  Neurological: She is alert.  Skin: Skin is warm and dry. She is not diaphoretic.  Psychiatric: She has a normal mood and affect. Her behavior is normal.  Nursing note and vitals reviewed.    ED Treatments / Results  Labs (all labs ordered are listed, but only abnormal results are displayed) Labs Reviewed  RAPID STREP SCREEN (NOT AT [facility])  CULTURE, GROUP A STREP [facility]    EKG  EKG Interpretation None       Radiology Dg Chest 2 View  Result Date: 09/06/2017 CLINICAL DATA:  Productive cough, shortness of breath and sore throat. EXAM: CHEST  2 VIEW COMPARISON:  Chest film from abdominal series on 11/29/2014 FINDINGS: The heart is mildly enlarged. There is no evidence of pulmonary edema, consolidation, pneumothorax, nodule or pleural fluid. The bony thorax is unremarkable. IMPRESSION: Mild cardiomegaly.  No acute findings. Electronically Signed   By: Irish LackGlenn  Yamagata M.D.   On: 09/06/2017 13:53    Procedures Procedures (including critical care time)  Medications Ordered in ED Medications - No data to display   Initial Impression / Assessment and Plan / ED Course  I have reviewed the triage vital signs and the nursing notes.  Pertinent labs & imaging results that were available during my care of the patient were reviewed by me and considered in my medical decision making (see chart for details).  Clinical Course as of Sep 06 1409  Sun Sep 06, 2017  1345 Patient not in room, likely in xray.  [SJ]    Clinical Course User Index [SJ] Joy, Shawn C, PA-C    Patient presents with cough, congestion, and sore throat.  Rapid strep  negative.  Chest x-ray without acute abnormality.  Symptoms consistent with illness of viral origin. The patient was given instructions for home care as well as return precautions. Patient voices understanding of these instructions, accepts the plan, and is comfortable with discharge.  Final Clinical Impressions(s) / ED Diagnoses   Final diagnoses:  Cough  Sore throat    ED Discharge Orders        Ordered    predniSONE (STERAPRED UNI-PAK 21 TAB) 10 MG (21) TBPK tablet     09/06/17 1407    benzonatate (TESSALON) 100 MG capsule  Every 8 hours     09/06/17 1410       Joy, RolandShawn  C, PA-C 09/06/17 1411    Maia Plan, MD 09/07/17 [id number]

## 2017-09-08 LAB — CULTURE, GROUP A STREP (THRC)

## 2019-08-03 ENCOUNTER — Encounter (HOSPITAL_COMMUNITY): Payer: Self-pay

## 2019-08-03 ENCOUNTER — Other Ambulatory Visit: Payer: Self-pay

## 2019-08-03 ENCOUNTER — Ambulatory Visit (HOSPITAL_COMMUNITY)
Admission: EM | Admit: 2019-08-03 | Discharge: 2019-08-03 | Disposition: A | Discharge status: Home / Self Care | Payer: Self-pay | Attending: Family Medicine | Admitting: Family Medicine

## 2019-08-03 DIAGNOSIS — B354 Tinea corporis: Secondary | ICD-10-CM

## 2019-08-03 DIAGNOSIS — L299 Pruritus, unspecified: Secondary | ICD-10-CM

## 2019-08-03 DIAGNOSIS — B029 Zoster without complications: Secondary | ICD-10-CM

## 2019-08-03 DIAGNOSIS — G4700 Insomnia, unspecified: Secondary | ICD-10-CM

## 2019-08-03 MED ORDER — HYDROXYZINE HCL 25 MG PO TABS: 12.5000 mg | ORAL_TABLET | Freq: Three times a day (TID) | ORAL | 0 refills | Status: DC | PRN

## 2019-08-03 MED ORDER — VALACYCLOVIR HCL 1 G PO TABS: 1000.0000 mg | ORAL_TABLET | Freq: Three times a day (TID) | ORAL | 0 refills | Status: AC

## 2019-08-03 MED ORDER — FLUCONAZOLE 200 MG PO TABS: 200.0000 mg | ORAL_TABLET | ORAL | 0 refills | Status: DC

## 2019-08-03 MED ORDER — TRAMADOL HCL 50 MG PO TABS: 50.0000 mg | ORAL_TABLET | Freq: Two times a day (BID) | ORAL | 0 refills | Status: DC | PRN

## 2019-08-03 NOTE — ED Provider Notes (Signed)
[place]   MRN: [id number] DOB: [birth year]  Subjective:   Katherine Werner is a 54 y.o. female presenting for 1 week history of rash over her right abdomen that wraps around to her back and itchy rash on her forearms bilaterally.  Patient states that the rash on her abdomen is really painful and makes it difficult for her to sleep at night.  Denies taking chronic medications.  No Known Allergies  History reviewed. No pertinent past medical history.   Past Surgical History:  Procedure Laterality Date  . TUBAL LIGATION      History reviewed. No pertinent family history.  Social History   Tobacco Use  . Smoking status: Current Every Day Smoker    Packs/day: 0.10    Types: Cigarettes  . Smokeless tobacco: Never Used  Substance Use Topics  . Alcohol use: Yes  . Drug use: Not on file    Review of Systems  Constitutional: Negative for fever and malaise/fatigue.  HENT: Negative for congestion, ear pain, sinus pain and sore throat.   Eyes: Negative for discharge and redness.  Respiratory: Negative for cough, hemoptysis, shortness of breath and wheezing.   Cardiovascular: Negative for chest pain.  Gastrointestinal: Positive for abdominal pain (From her rash). Negative for diarrhea, nausea and vomiting.  Genitourinary: Negative for dysuria, flank pain and hematuria.  Musculoskeletal: Negative for myalgias.  Skin: Positive for itching and rash.  Neurological: Negative for dizziness, weakness and headaches.  Psychiatric/Behavioral: Negative for depression and substance abuse.     Objective:   Vitals: BP 124/80 (BP Location: Right Arm)   Pulse 83   Temp 98.3 F (36.8 C)   Resp 16   Wt 154 lb (69.9 kg)   LMP 04/11/2013   SpO2 100%   BMI 27.28 kg/m   Physical Exam Constitutional:      General: She is not in acute distress.    Appearance: Normal appearance. She is well-developed. She is not ill-appearing, toxic-appearing or diaphoretic.  HENT:     Head:  Normocephalic and atraumatic.     Nose: Nose normal.     Mouth/Throat:     Mouth: Mucous membranes are moist.     Pharynx: Oropharynx is clear.  Eyes:     General: No scleral icterus.    Extraocular Movements: Extraocular movements intact.     Pupils: Pupils are equal, round, and reactive to light.  Cardiovascular:     Rate and Rhythm: Normal rate.  Pulmonary:     Effort: Pulmonary effort is normal.  Skin:    General: Skin is warm and dry.     Findings: Rash (2 distinct rashes, I was able to take a picture of her vesicular painful rash on the abdomen consistent with shingles as depicted; there are other annular lesions with hyperpigmentation centrally over her forearms bilaterally of varying sizes between 1-2cm) present.  Neurological:     General: No focal deficit present.     Mental Status: She is alert and oriented to person, place, and time.  Psychiatric:        Mood and Affect: Mood normal.        Behavior: Behavior normal.        Thought Content: Thought content normal.        Judgment: Judgment normal.          Assessment and Plan :   1. Herpes zoster without complication   2. Itching   3. Insomnia, unspecified type   4. Pruritus  5. Tinea corporis     Start Valtrex for shingles rash, use tramadol for pain.  Start Diflucan for tinea corporis of the forearms.  Use hydroxyzine for itching. Counseled patient on potential for adverse effects with medications prescribed/recommended today, ER and return-to-clinic precautions discussed, patient verbalized understanding.    Wallis Bamberg, [place] 08/03/19 1637

## 2019-08-03 NOTE — ED Triage Notes (Signed)
Pt states she has the shingles on her right side and back x 1 week.

## 2019-08-15 ENCOUNTER — Encounter (INDEPENDENT_AMBULATORY_CARE_PROVIDER_SITE_OTHER): Payer: Self-pay | Admitting: Primary Care

## 2019-08-15 ENCOUNTER — Ambulatory Visit (INDEPENDENT_AMBULATORY_CARE_PROVIDER_SITE_OTHER): Payer: Self-pay | Admitting: Primary Care

## 2019-08-15 ENCOUNTER — Other Ambulatory Visit: Payer: Self-pay

## 2019-08-15 VITALS — BP 146/87 | HR 85 | Temp 97.2°F | Ht 63.0 in | Wt 146.0 lb

## 2019-08-15 DIAGNOSIS — B029 Zoster without complications: Secondary | ICD-10-CM

## 2019-08-15 DIAGNOSIS — F17200 Nicotine dependence, unspecified, uncomplicated: Secondary | ICD-10-CM

## 2019-08-15 DIAGNOSIS — R030 Elevated blood-pressure reading, without diagnosis of hypertension: Secondary | ICD-10-CM

## 2019-08-15 DIAGNOSIS — Z1231 Encounter for screening mammogram for malignant neoplasm of breast: Secondary | ICD-10-CM

## 2019-08-15 DIAGNOSIS — E781 Pure hyperglyceridemia: Secondary | ICD-10-CM

## 2019-08-15 MED ORDER — GABAPENTIN 100 MG PO CAPS: 100.0000 mg | ORAL_CAPSULE | Freq: Three times a day (TID) | ORAL | 3 refills | Status: DC

## 2019-08-15 MED FILL — GABAPENTIN 100 MG CAP: 100 | 30 days supply | Qty: 90 | Fill #0

## 2019-08-15 NOTE — Patient Instructions (Signed)

## 2019-08-15 NOTE — Progress Notes (Signed)
Established Patient Office Visit  Subjective:  Patient ID: Katherine Werner, female    DOB: 10/26/1965  Age: 54 y.o. MRN: [id number]  CC:  Chief Complaint  Patient presents with  . Establish Care    HPI JULIANNA VANWAGNER presents for establishment of care with new provider but an established patient at [facility]. She is a hospital follow up diagnoses with shingles and is complaining of burning and pain she below picture.  No past medical history on file.  Past Surgical History:  Procedure Laterality Date  . TUBAL LIGATION      No family history on file.  Social History   Socioeconomic History  . Marital status: Single    Spouse name: Not on file  . Number of children: Not on file  . Years of education: Not on file  . Highest education level: Not on file  Occupational History  . Not on file  Tobacco Use  . Smoking status: Current Every Day Smoker    Packs/day: 0.10    Types: Cigarettes  . Smokeless tobacco: Never Used  Substance and Sexual Activity  . Alcohol use: Yes  . Drug use: Not on file  . Sexual activity: Yes    Birth control/protection: Surgical  Other Topics Concern  . Not on file  Social History Narrative  . Not on file   Social Determinants of Health   Financial Resource Strain:   . Difficulty of Paying Living Expenses: Not on file  Food Insecurity:   . Worried About Programme researcher, broadcasting/film/video in the Last Year: Not on file  . Ran Out of Food in the Last Year: Not on file  Transportation Needs:   . Lack of Transportation (Medical): Not on file  . Lack of Transportation (Non-Medical): Not on file  Physical Activity:   . Days of Exercise per Week: Not on file  . Minutes of Exercise per Session: Not on file  Stress:   . Feeling of Stress : Not on file  Social Connections:   . Frequency of Communication with Friends and Family: Not on file  . Frequency of Social Gatherings with Friends and Family: Not on file  . Attends Religious Services: Not on file  . Active  Member of Clubs or Organizations: Not on file  . Attends Banker Meetings: Not on file  . Marital Status: Not on file  Intimate Partner Violence:   . Fear of Current or Ex-Partner: Not on file  . Emotionally Abused: Not on file  . Physically Abused: Not on file  . Sexually Abused: Not on file    Outpatient Medications Prior to Visit  Medication Sig Dispense Refill  . hydrOXYzine (ATARAX/VISTARIL) 25 MG tablet Take 0.5-1 tablets (12.5-25 mg total) by mouth every 8 (eight) hours as needed for itching. 30 tablet 0  . traMADol (ULTRAM) 50 MG tablet Take 1 tablet (50 mg total) by mouth every 12 (twelve) hours as needed for severe pain. 15 tablet 0  . fluconazole (DIFLUCAN) 200 MG tablet Take 1 tablet (200 mg total) by mouth once a week. 2 tablet 0   No facility-administered medications prior to visit.    No Known Allergies  ROS Review of Systems  Skin: Positive for rash.       Shingles Right side abdomen      Objective:    Physical Exam  Constitutional: She is oriented to person, place, and time. She appears well-developed and well-nourished.  HENT:  Head: Normocephalic.  Eyes:  Pupils are equal, round, and reactive to light. EOM are normal.  Cardiovascular: Normal rate and regular rhythm.  Pulmonary/Chest: Effort normal and breath sounds normal.  Abdominal: Soft. Bowel sounds are normal.  Musculoskeletal:        General: Normal range of motion.     Cervical back: Normal range of motion and neck supple.  Neurological: She is oriented to person, place, and time.  Skin: Rash noted.  Psychiatric: She has a normal mood and affect. Her behavior is normal. Judgment and thought content normal.    BP (!) 146/87 (BP Location: Left Arm, Patient Position: Sitting, Cuff Size: Small)   Pulse 85   Temp (!) 97.2 F (36.2 C) (Temporal)   Ht 5\' 3"  (1.6 m)   Wt 146 lb (66.2 kg)   LMP 04/11/2013   SpO2 95%   BMI 25.86 kg/m  Wt Readings from Last 3 Encounters:  08/15/19  146 lb (66.2 kg)  08/03/19 154 lb (69.9 kg)  09/06/17 140 lb (63.5 kg)     Health Maintenance Due  Topic Date Due  . COLONOSCOPY  07/21/2014  . MAMMOGRAM  01/10/2016  . PAP SMEAR-Modifier  04/11/2019    There are no preventive care reminders to display for this patient.  Lab Results  Component Value Date   TSH 1.680 04/06/2017   Lab Results  Component Value Date   WBC 5.8 04/06/2017   HGB 12.0 04/06/2017   HCT 37.5 04/06/2017   MCV 78 (L) 04/06/2017   PLT 268 04/06/2017   Lab Results  Component Value Date   NA 141 04/06/2017   K 4.5 04/06/2017   CO2 22 04/06/2017   GLUCOSE 89 04/06/2017   BUN 7 04/06/2017   CREATININE 0.85 04/06/2017   BILITOT <0.2 04/06/2017   ALKPHOS 92 04/06/2017   AST 30 04/06/2017   ALT 14 04/06/2017   PROT 8.5 04/06/2017   ALBUMIN 4.0 04/06/2017   CALCIUM 9.2 04/06/2017   Lab Results  Component Value Date   CHOL 227 (H) 04/06/2017   Lab Results  Component Value Date   HDL 62 04/06/2017   Lab Results  Component Value Date   LDLCALC Comment 04/06/2017   Lab Results  Component Value Date   TRIG 513 (H) 04/06/2017   Lab Results  Component Value Date   CHOLHDL 3.7 04/06/2017   No results found for: HGBA1C    Assessment & Plan:  [place] was seen today for establish care.  Diagnoses and all orders for this visit:  Herpes zoster without complication neuropathy -     gabapentin (NEURONTIN) 100 MG capsule; Take 1 capsule (100 mg total) by mouth 3 (three) times daily.    -     CBC with Differential  Hypertriglyceridemia -     Lipid Panel  Elevated blood pressure reading in office without diagnosis of hypertension -     Complete Metabolic Panel with GFR  Encounter for screening mammogram for malignant neoplasm of breast Patient completed application for BCCP while in clinic and application has and faxed to [facility]. Patient aware that [facility] [address] will contact her directly to schedule appointment.  Tobacco dependence She is aware of  increased risk for lung cancer and other respiratory diseases recommend cessation.  This will be reminded at each clinical visit.   Other orders -     gabapentin (NEURONTIN) 100 MG capsule; Take 1 capsule (100 mg total) by mouth 3 (three) times daily.    Follow-up: No follow-ups on file.  Kerin Perna, NP

## 2019-08-16 ENCOUNTER — Encounter (INDEPENDENT_AMBULATORY_CARE_PROVIDER_SITE_OTHER): Payer: Self-pay | Admitting: Primary Care

## 2019-08-16 ENCOUNTER — Other Ambulatory Visit (INDEPENDENT_AMBULATORY_CARE_PROVIDER_SITE_OTHER): Payer: Self-pay | Admitting: Primary Care

## 2019-08-16 LAB — LIPID PANEL
Chol/HDL Ratio: 4.6 ratio — ABNORMAL HIGH (ref 0.0–4.4)
Cholesterol, Total: 230 mg/dL — ABNORMAL HIGH (ref 100–199)
HDL: 50 mg/dL (ref >39)
LDL Chol Calc (NIH): 141 mg/dL — ABNORMAL HIGH (ref 0–99)
Triglycerides: 215 mg/dL — ABNORMAL HIGH (ref 0–149)
VLDL Cholesterol Cal: 39 mg/dL (ref 5–40)

## 2019-08-16 LAB — CBC WITH DIFFERENTIAL/PLATELET
Basophils Absolute: 0.1 10*3/uL (ref 0.0–0.2)
Basos: 1 %
EOS (ABSOLUTE): 0.2 10*3/uL (ref 0.0–0.4)
Eos: 4 %
Hematocrit: 37.3 % (ref 34.0–46.6)
Hemoglobin: 11.9 g/dL (ref 11.1–15.9)
Immature Grans (Abs): 0 10*3/uL (ref 0.0–0.1)
Immature Granulocytes: 0 %
Lymphocytes Absolute: 2 10*3/uL (ref 0.7–3.1)
Lymphs: 40 %
MCH: 24.5 pg — ABNORMAL LOW (ref 26.6–33.0)
MCHC: 31.9 g/dL (ref 31.5–35.7)
MCV: 77 fL — ABNORMAL LOW (ref 79–97)
Monocytes Absolute: 0.6 10*3/uL (ref 0.1–0.9)
Monocytes: 12 %
Neutrophils Absolute: 2.2 10*3/uL (ref 1.4–7.0)
Neutrophils: 43 %
Platelets: 330 10*3/uL (ref 150–450)
RBC: 4.86 x10E6/uL (ref 3.77–5.28)
RDW: 14.5 % (ref 11.7–15.4)
WBC: 5.1 10*3/uL (ref 3.4–10.8)

## 2019-08-16 LAB — CMP14+EGFR
ALT: 14 IU/L (ref 0–32)
AST: 26 IU/L (ref 0–40)
Albumin/Globulin Ratio: 1 — ABNORMAL LOW (ref 1.2–2.2)
Albumin: 4 g/dL (ref 3.8–4.9)
Alkaline Phosphatase: 101 IU/L (ref 39–117)
BUN/Creatinine Ratio: 7 — ABNORMAL LOW (ref 9–23)
BUN: 7 mg/dL (ref 6–24)
Bilirubin Total: 0.4 mg/dL (ref 0.0–1.2)
CO2: 20 mmol/L (ref 20–29)
Calcium: 9.9 mg/dL (ref 8.7–10.2)
Chloride: 104 mmol/L (ref 96–106)
Creatinine, Ser: 1 mg/dL (ref 0.57–1.00)
GFR calc Af Amer: 73 mL/min/{1.73_m2} (ref >59)
GFR calc non Af Amer: 64 mL/min/{1.73_m2} (ref >59)
Globulin, Total: 4.2 g/dL (ref 1.5–4.5)
Glucose: 105 mg/dL — ABNORMAL HIGH (ref 65–99)
Potassium: 4.6 mmol/L (ref 3.5–5.2)
Sodium: 139 mmol/L (ref 134–144)
Total Protein: 8.2 g/dL (ref 6.0–8.5)

## 2019-08-16 MED ORDER — ATORVASTATIN CALCIUM 40 MG PO TABS: 40.0000 mg | ORAL_TABLET | Freq: Every day | ORAL | 3 refills | Status: DC

## 2019-08-17 ENCOUNTER — Telehealth (INDEPENDENT_AMBULATORY_CARE_PROVIDER_SITE_OTHER): Payer: Self-pay

## 2019-08-17 ENCOUNTER — Other Ambulatory Visit (INDEPENDENT_AMBULATORY_CARE_PROVIDER_SITE_OTHER): Payer: Self-pay | Admitting: Primary Care

## 2019-08-17 MED ORDER — ATORVASTATIN CALCIUM 40 MG PO TABS: 40.0000 mg | ORAL_TABLET | Freq: Every day | ORAL | 3 refills | Status: DC

## 2019-08-17 MED FILL — ATORVASTATIN CALCIUM 40 MG: 40 | 30 days supply | Qty: 30 | Fill #0

## 2019-08-17 NOTE — Telephone Encounter (Signed)
Patient verified date of birth. She is aware that all labs are normal except elevated cholesterol; which can lead to stroke and heat attack. Patient informed that atorvastatin has been sent to the pharmacy. Advised her to decrease fatty food, red meat, cheese and milk. Advised her to increase fiber with whole grains and veggies. She verbalized understanding of results.

## 2019-08-17 NOTE — Telephone Encounter (Signed)
-----   Message from Grayce Sessions, NP sent at 08/16/2019  7:55 PM EST ----- Your lipid panel (cholesterol is elevated) this can lead to heart attack and stroke. To lower your number you can decrease your fatty foods, red meat, cheese, milk and increase fiber like whole grains and veggies.  Sent in atorvastatin 40mg  Qhs

## 2019-09-16 ENCOUNTER — Ambulatory Visit (INDEPENDENT_AMBULATORY_CARE_PROVIDER_SITE_OTHER): Payer: Medicaid Other | Admitting: Primary Care

## 2019-09-16 MED FILL — GABAPENTIN 100 MG CAPSULE: 100 | 30 days supply | Qty: 90 | Fill #1

## 2019-09-16 MED FILL — ?ATORVASTATIN 40MG TABLET: 40 | 30 days supply | Qty: 30 | Fill #1

## 2019-10-10 ENCOUNTER — Other Ambulatory Visit: Payer: Self-pay

## 2019-10-10 ENCOUNTER — Ambulatory Visit (INDEPENDENT_AMBULATORY_CARE_PROVIDER_SITE_OTHER): Payer: Self-pay | Admitting: Primary Care

## 2019-10-10 ENCOUNTER — Encounter (INDEPENDENT_AMBULATORY_CARE_PROVIDER_SITE_OTHER): Payer: Self-pay | Admitting: Primary Care

## 2019-10-10 ENCOUNTER — Other Ambulatory Visit (HOSPITAL_COMMUNITY)
Admission: RE | Admit: 2019-10-10 | Discharge: 2019-10-10 | Disposition: A | Discharge status: Home / Self Care | Payer: Medicaid Other | Source: Ambulatory Visit | Attending: Primary Care | Admitting: Primary Care

## 2019-10-10 VITALS — BP 156/80 | HR 69 | Temp 96.3°F | Ht 63.0 in | Wt 143.0 lb

## 2019-10-10 DIAGNOSIS — Z113 Encounter for screening for infections with a predominantly sexual mode of transmission: Secondary | ICD-10-CM

## 2019-10-10 DIAGNOSIS — Z124 Encounter for screening for malignant neoplasm of cervix: Secondary | ICD-10-CM | POA: Insufficient documentation

## 2019-10-10 DIAGNOSIS — Z1231 Encounter for screening mammogram for malignant neoplasm of breast: Secondary | ICD-10-CM

## 2019-10-10 DIAGNOSIS — F17200 Nicotine dependence, unspecified, uncomplicated: Secondary | ICD-10-CM

## 2019-10-10 DIAGNOSIS — Z1211 Encounter for screening for malignant neoplasm of colon: Secondary | ICD-10-CM

## 2019-10-10 DIAGNOSIS — R030 Elevated blood-pressure reading, without diagnosis of hypertension: Secondary | ICD-10-CM

## 2019-10-10 NOTE — Patient Instructions (Signed)

## 2019-10-10 NOTE — Progress Notes (Signed)
Subjective:     Katherine Werner is a 54 y.o. female and is here for a comprehensive  exam.   Social History   Socioeconomic History  . Marital status: Single    Spouse name: Not on file  . Number of children: Not on file  . Years of education: Not on file  . Highest education level: Not on file  Occupational History  . Not on file  Tobacco Use  . Smoking status: Current Every Day Smoker    Packs/day: 0.10    Types: Cigarettes  . Smokeless tobacco: Never Used  Substance and Sexual Activity  . Alcohol use: Yes  . Drug use: Not on file  . Sexual activity: Yes    Birth control/protection: Surgical  Other Topics Concern  . Not on file  Social History Narrative  . Not on file   Social Determinants of Health   Financial Resource Strain:   . Difficulty of Paying Living Expenses:   Food Insecurity:   . Worried About Programme researcher, broadcasting/film/video in the Last Year:   . Barista in the Last Year:   Transportation Needs:   . Freight forwarder (Medical):   Marland Kitchen Lack of Transportation (Non-Medical):   Physical Activity:   . Days of Exercise per Week:   . Minutes of Exercise per Session:   Stress:   . Feeling of Stress :   Social Connections:   . Frequency of Communication with Friends and Family:   . Frequency of Social Gatherings with Friends and Family:   . Attends Religious Services:   . Active Member of Clubs or Organizations:   . Attends Banker Meetings:   Marland Kitchen Marital Status:   Intimate Partner Violence:   . Fear of Current or Ex-Partner:   . Emotionally Abused:   Marland Kitchen Physically Abused:   . Sexually Abused:    Health Maintenance  Topic Date Due  . COLONOSCOPY  Never done  . MAMMOGRAM  01/10/2016  . PAP SMEAR-Modifier  04/11/2019  . INFLUENZA VACCINE  10/26/2019 (Originally 02/26/2019)  . TETANUS/TDAP  04/07/2027  . Hepatitis C Screening  Completed  . HIV Screening  Completed    Review of Systems Review of Systems  All other systems reviewed and are  negative.   Objective:     Assessment:  CONSTITUTIONAL: Well-developed, well-nourished female in no acute distress.  HENT:  Normocephalic, atraumatic, External right and left ear normal.  EYES: Conjunctivae and EOM are normal. Pupils are equal, round, and reactive to light. No scleral icterus.  NECK: Normal range of motion, supple, no masses.  Normal thyroid.  SKIN: Skin is warm and dry. No rash noted. Not diaphoretic. No erythema. No pallor. NEUROLGIC: Alert and oriented to person, place, and time. Normal reflexes, muscle tone coordination. No cranial nerve deficit noted. PSYCHIATRIC: Normal mood and affect. Normal behavior. Normal judgment and thought content. CARDIOVASCULAR: Normal heart rate noted, regular rhythm RESPIRATORY: Clear to auscultation bilaterally. Effort and breath sounds normal, no problems with respiration noted. BREASTS: taught self breast examination. ABDOMEN: Soft, normal bowel sounds, no distention noted.  No tenderness, rebound or guarding.  PELVIC: Normal appearing external genitalia; normal appearing vaginal mucosa and cervix.  No abnormal discharge noted.  Pap smear obtained.  Normal uterine size, no other palpable masses, no uterine or adnexal tenderness. MUSCULOSKELETAL: Normal range of motion. No tenderness.  No cyanosis, clubbing, or edema.  2+ distal pulses. Plan:     Katherine Werner was seen today for  gynecologic exam.  Diagnoses and all orders for this visit:  Cervical cancer screening The USPSTF recommendations screening of cervical cancer every 3 years with cervical cytology.  All women age 57 to 10 years are at risk for cervical cancer because of potential exposure to high risk HPV types to sexual intercourse and should be screened.  -     Cytology - PAP  Screening for STDs (sexually transmitted diseases) -     Cervicovaginal ancillary only  Breast cancer screening by mammogram Patient completed application for BCCP while in clinic and application has and  faxed to [facility]. Patient aware that [facility] will contact her directly to schedule appointment.  Tobacco dependence Each visit we will discuss increased risk for lung cancer and other respiratory diseases recommend cessation.   Screening for colon cancer Normal colon cancer screening.  CDC recommends colorectal screening from ages 77-75.  -     Fecal occult blood, imunochemical; Future  Elevated blood pressure reading in office without diagnosis of hypertension She had a cigarette prior to visit which may have increased blood pressure. BP (!) 156/80 (BP Location: Left Arm, Patient Position: Sitting, Cuff Size: Small)   Pulse 69   Temp (!) 96.3 F (35.7 C) (Temporal)   Ht 5\' 3"  (1.6 m)   Wt 143 lb (64.9 kg)   LMP 04/11/2013   SpO2 100%   BMI 25.33 kg/m  08/15/2019-146/87 normal 08/03/2019 -124/80  See After Visit Summary for Counseling Recommendations

## 2019-10-12 ENCOUNTER — Other Ambulatory Visit (INDEPENDENT_AMBULATORY_CARE_PROVIDER_SITE_OTHER): Payer: Self-pay | Admitting: Primary Care

## 2019-10-12 ENCOUNTER — Telehealth (INDEPENDENT_AMBULATORY_CARE_PROVIDER_SITE_OTHER): Payer: Self-pay

## 2019-10-12 LAB — CERVICOVAGINAL ANCILLARY ONLY
Bacterial Vaginitis (gardnerella): POSITIVE — AB
Candida Glabrata: NEGATIVE
Candida Vaginitis: NEGATIVE
Chlamydia: NEGATIVE
Comment: NEGATIVE
Comment: NEGATIVE
Comment: NEGATIVE
Comment: NEGATIVE
Comment: NEGATIVE
Comment: NORMAL
Neisseria Gonorrhea: NEGATIVE
Trichomonas: NEGATIVE

## 2019-10-12 MED ORDER — METRONIDAZOLE 500 MG PO TABS: 500.0000 mg | ORAL_TABLET | Freq: Two times a day (BID) | ORAL | 0 refills | Status: DC

## 2019-10-12 NOTE — Telephone Encounter (Signed)
-----   Message from Grayce Sessions, NP sent at 10/12/2019  4:57 PM EDT ----- Patient has BV sent in flagyl

## 2019-10-12 NOTE — Telephone Encounter (Signed)
After verifying date of birth patient was informed that she is positive for BV. Medication sent to pharmacy. Informed patient that once pap results were available she would receive a call. Abx was sent to wrong pharmacy. Patient asked to have it sent to [facility]. Reordered medication and sent to correct pharmacy. Maryjean Morn, CMA

## 2019-10-13 LAB — CYTOLOGY - PAP: Diagnosis: NEGATIVE

## 2019-10-13 MED FILL — metroNIDAZOLE 500 MG TABS: 500 | 7 days supply | Qty: 14 | Fill #0

## 2019-10-18 ENCOUNTER — Telehealth (INDEPENDENT_AMBULATORY_CARE_PROVIDER_SITE_OTHER): Payer: Self-pay

## 2019-10-18 MED FILL — ?ATORVASTATIN 40MG TABLET: 40 | 30 days supply | Qty: 30 | Fill #2

## 2019-10-18 NOTE — Telephone Encounter (Signed)
-----   Message from Grayce Sessions, NP sent at 10/18/2019 11:23 AM EDT ----- Pap results normal and recommended every 3 years

## 2019-10-18 NOTE — Telephone Encounter (Signed)
Patient verified date of birth. She is aware that pap is normal and recommended to repeat in 3 years. She verbalized understanding. Katherine Werner, CMA

## 2019-10-31 MED FILL — GABAPENTIN 100 MG CAPSULE: 100 | 30 days supply | Qty: 90 | Fill #2

## 2019-11-21 MED FILL — ?ATORVASTATIN 40MG TABLET: 40 | 30 days supply | Qty: 30 | Fill #3

## 2019-12-12 MED FILL — GABAPENTIN 100 MG CAPSULE: 100 | 30 days supply | Qty: 90 | Fill #3

## 2019-12-12 MED FILL — ?ATORVASTATIN 40MG TABLET: 40 | 30 days supply | Qty: 30 | Fill #4

## 2020-01-11 ENCOUNTER — Other Ambulatory Visit (INDEPENDENT_AMBULATORY_CARE_PROVIDER_SITE_OTHER): Payer: Self-pay | Admitting: Primary Care

## 2020-01-11 MED FILL — ?ATORVASTATIN 40MG TABLET: 40 | 30 days supply | Qty: 30 | Fill #5

## 2020-01-12 NOTE — Telephone Encounter (Signed)
Sent to PCP ?

## 2020-01-18 ENCOUNTER — Other Ambulatory Visit (INDEPENDENT_AMBULATORY_CARE_PROVIDER_SITE_OTHER): Payer: Self-pay | Admitting: Primary Care

## 2020-01-19 MED FILL — GABAPENTIN 100 MG CAPSULE: 100 | 30 days supply | Qty: 90 | Fill #0

## 2020-02-13 ENCOUNTER — Other Ambulatory Visit: Payer: Self-pay

## 2020-02-13 ENCOUNTER — Encounter (INDEPENDENT_AMBULATORY_CARE_PROVIDER_SITE_OTHER): Payer: Self-pay | Admitting: Primary Care

## 2020-02-13 ENCOUNTER — Ambulatory Visit (INDEPENDENT_AMBULATORY_CARE_PROVIDER_SITE_OTHER): Payer: Self-pay | Admitting: Primary Care

## 2020-02-13 VITALS — BP 123/77 | HR 86 | Temp 98.1°F | Ht 63.0 in | Wt 144.0 lb

## 2020-02-13 DIAGNOSIS — Z1231 Encounter for screening mammogram for malignant neoplasm of breast: Secondary | ICD-10-CM

## 2020-02-13 DIAGNOSIS — E781 Pure hyperglyceridemia: Secondary | ICD-10-CM

## 2020-02-13 DIAGNOSIS — Z0130 Encounter for examination of blood pressure without abnormal findings: Secondary | ICD-10-CM

## 2020-02-13 DIAGNOSIS — B029 Zoster without complications: Secondary | ICD-10-CM

## 2020-02-13 NOTE — Progress Notes (Signed)
Established Patient Office Visit  Subjective:  Patient ID: Katherine Werner, female    DOB: [birth year]  Age: 54 y.o. MRN: [id number]  CC:  Chief Complaint  Patient presents with  . Blood Pressure Check    Pt. is here for blood pressure check.     HPI Katherine Werner is a 54 year old female presents for follow up on blood pressure previous visit elevated. Discussed dietary modifications , monitoring sodium intake. Bp unremarkable on no medication. She is inquiring about vaccines for whooping cough and shingles. Directed her to call local health department   History reviewed. No pertinent past medical history.  Past Surgical History:  Procedure Laterality Date  . TUBAL LIGATION      History reviewed. No pertinent family history.  Social History   Socioeconomic History  . Marital status: Single    Spouse name: Not on file  . Number of children: Not on file  . Years of education: Not on file  . Highest education level: Not on file  Occupational History  . Not on file  Tobacco Use  . Smoking status: Current Every Day Smoker    Packs/day: 0.10    Types: Cigarettes  . Smokeless tobacco: Never Used  Substance and Sexual Activity  . Alcohol use: Yes  . Drug use: Not on file  . Sexual activity: Yes    Birth control/protection: Surgical  Other Topics Concern  . Not on file  Social History Narrative  . Not on file   Social Determinants of Health   Financial Resource Strain:   . Difficulty of Paying Living Expenses:   Food Insecurity:   . Worried About Charity fundraiser in the Last Year:   . Arboriculturist in the Last Year:   Transportation Needs:   . Film/video editor (Medical):   Marland Kitchen Lack of Transportation (Non-Medical):   Physical Activity:   . Days of Exercise per Week:   . Minutes of Exercise per Session:   Stress:   . Feeling of Stress :   Social Connections:   . Frequency of Communication with Friends and Family:   . Frequency of Social Gatherings  with Friends and Family:   . Attends Religious Services:   . Active Member of Clubs or Organizations:   . Attends Archivist Meetings:   Marland Kitchen Marital Status:   Intimate Partner Violence:   . Fear of Current or Ex-Partner:   . Emotionally Abused:   Marland Kitchen Physically Abused:   . Sexually Abused:     Outpatient Medications Prior to Visit  Medication Sig Dispense Refill  . atorvastatin (LIPITOR) 40 MG tablet Take 1 tablet (40 mg total) by mouth daily. 90 tablet 3  . gabapentin (NEURONTIN) 100 MG capsule TAKE 1 CAPSULE (100 MG TOTAL) BY MOUTH 3 (THREE) TIMES DAILY. 90 capsule 3  . metroNIDAZOLE (FLAGYL) 500 MG tablet Take 1 tablet (500 mg total) by mouth 2 (two) times daily. 14 tablet 0  . hydrOXYzine (ATARAX/VISTARIL) 25 MG tablet Take 0.5-1 tablets (12.5-25 mg total) by mouth every 8 (eight) hours as needed for itching. (Patient not taking: Reported on 10/10/2019) 30 tablet 0   No facility-administered medications prior to visit.    No Known Allergies  ROS Review of Systems  Respiratory: Positive for cough.   All other systems reviewed and are negative.     Objective:    BP 123/77 (BP Location: Left Arm, Patient Position: Sitting, Cuff Size: Normal)  Pulse 86   Temp 98.1 F (36.7 C) (Oral)   Ht '5\' 3"'$  (1.6 m)   Wt 144 lb (65.3 kg)   LMP 04/11/2013   SpO2 97%   BMI 25.51 kg/m  Physical Exam Vitals reviewed.  Constitutional:      Appearance: Normal appearance.  HENT:     Head: Normocephalic.     Right Ear: Tympanic membrane normal.     Left Ear: Tympanic membrane normal.     Nose: Nose normal.  Cardiovascular:     Rate and Rhythm: Normal rate and regular rhythm.     Pulses: Normal pulses.     Heart sounds: Normal heart sounds.  Pulmonary:     Effort: Pulmonary effort is normal.     Breath sounds: Normal breath sounds.  Musculoskeletal:        General: Normal range of motion.     Cervical back: Normal range of motion.  Skin:    General: Skin is warm and dry.   Neurological:     Mental Status: She is alert and oriented to person, place, and time.  Psychiatric:        Mood and Affect: Mood normal.        Behavior: Behavior normal.        Thought Content: Thought content normal.        Judgment: Judgment normal.     Health Maintenance Due  Topic Date Due  . COVID-19 Vaccine (1) Never done  . COLONOSCOPY  Never done  . MAMMOGRAM  01/10/2016  . COLON CANCER SCREENING ANNUAL FOBT  04/07/2018    There are no preventive care reminders to display for this patient.  Lab Results  Component Value Date   TSH 1.680 04/06/2017   Lab Results  Component Value Date   WBC 5.3 02/13/2020   HGB 10.2 (L) 02/13/2020   HCT 31.9 (L) 02/13/2020   MCV 81 02/13/2020   PLT 225 02/13/2020   Lab Results  Component Value Date   NA 141 02/13/2020   K 3.9 02/13/2020   CO2 21 02/13/2020   GLUCOSE 96 02/13/2020   BUN 5 (L) 02/13/2020   CREATININE 0.62 02/13/2020   BILITOT <0.2 02/13/2020   ALKPHOS 107 02/13/2020   AST 28 02/13/2020   ALT 17 02/13/2020   PROT 7.4 02/13/2020   ALBUMIN 3.8 02/13/2020   CALCIUM 8.7 02/13/2020   Lab Results  Component Value Date   CHOL 147 02/13/2020   Lab Results  Component Value Date   HDL 53 02/13/2020   Lab Results  Component Value Date   LDLCALC 46 02/13/2020   Lab Results  Component Value Date   TRIG 318 (H) 02/13/2020   Lab Results  Component Value Date   CHOLHDL 2.8 02/13/2020   No results found for: HGBA1C    Assessment & Plan:   Katherine Werner was seen today for blood pressure check.  Diagnoses and all orders for this visit:  Hypertriglyceridemia Continue to decrease your fatty foods, red meat, cheese, milk and increase fiber like whole grains and veggies. Currently taking atorvastatin '40mg'$  at bedtime -     CBC with Differential -     CMP14+EGFR -     Lipid Panel  Blood pressure check Unremarkable Bp today 123/77 Continue to follow low-sodium, DASH diet, medication compliance, 150 minutes of  moderate intensity exercise per week.  -     CBC with Differential -     CMP14+EGFR  Breast cancer screening by mammogram Patient completed  application for BCCP while in clinic and application has and faxed to [facility]. Patient aware that [facility] will contact her directly to schedule appointment. 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   Follow-up: No follow-ups on file.    Kerin Perna, NP

## 2020-02-14 LAB — CMP14+EGFR
ALT: 17 IU/L (ref 0–32)
AST: 28 IU/L (ref 0–40)
Albumin/Globulin Ratio: 1.1 — ABNORMAL LOW (ref 1.2–2.2)
Albumin: 3.8 g/dL (ref 3.8–4.9)
Alkaline Phosphatase: 107 IU/L (ref 48–121)
BUN/Creatinine Ratio: 8 — ABNORMAL LOW (ref 9–23)
BUN: 5 mg/dL — ABNORMAL LOW (ref 6–24)
Bilirubin Total: <0.2 mg/dL (ref 0.0–1.2)
CO2: 21 mmol/L (ref 20–29)
Calcium: 8.7 mg/dL (ref 8.7–10.2)
Chloride: 106 mmol/L (ref 96–106)
Creatinine, Ser: 0.62 mg/dL (ref 0.57–1.00)
GFR calc Af Amer: 117 mL/min/{1.73_m2} (ref >59)
GFR calc non Af Amer: 102 mL/min/{1.73_m2} (ref >59)
Globulin, Total: 3.6 g/dL (ref 1.5–4.5)
Glucose: 96 mg/dL (ref 65–99)
Potassium: 3.9 mmol/L (ref 3.5–5.2)
Sodium: 141 mmol/L (ref 134–144)
Total Protein: 7.4 g/dL (ref 6.0–8.5)

## 2020-02-14 LAB — CBC WITH DIFFERENTIAL/PLATELET
Basophils Absolute: 0 10*3/uL (ref 0.0–0.2)
Basos: 1 %
EOS (ABSOLUTE): 0.2 10*3/uL (ref 0.0–0.4)
Eos: 4 %
Hematocrit: 31.9 % — ABNORMAL LOW (ref 34.0–46.6)
Hemoglobin: 10.2 g/dL — ABNORMAL LOW (ref 11.1–15.9)
Immature Grans (Abs): 0 10*3/uL (ref 0.0–0.1)
Immature Granulocytes: 0 %
Lymphocytes Absolute: 2.5 10*3/uL (ref 0.7–3.1)
Lymphs: 48 %
MCH: 25.8 pg — ABNORMAL LOW (ref 26.6–33.0)
MCHC: 32 g/dL (ref 31.5–35.7)
MCV: 81 fL (ref 79–97)
Monocytes Absolute: 0.3 10*3/uL (ref 0.1–0.9)
Monocytes: 5 %
Neutrophils Absolute: 2.2 10*3/uL (ref 1.4–7.0)
Neutrophils: 42 %
Platelets: 225 10*3/uL (ref 150–450)
RBC: 3.95 x10E6/uL (ref 3.77–5.28)
RDW: 14.7 % (ref 11.7–15.4)
WBC: 5.3 10*3/uL (ref 3.4–10.8)

## 2020-02-14 LAB — LIPID PANEL
Chol/HDL Ratio: 2.8 ratio (ref 0.0–4.4)
Cholesterol, Total: 147 mg/dL (ref 100–199)
HDL: 53 mg/dL (ref >39)
LDL Chol Calc (NIH): 46 mg/dL (ref 0–99)
Triglycerides: 318 mg/dL — ABNORMAL HIGH (ref 0–149)
VLDL Cholesterol Cal: 48 mg/dL — ABNORMAL HIGH (ref 5–40)

## 2020-02-17 ENCOUNTER — Telehealth: Payer: Self-pay

## 2020-02-17 ENCOUNTER — Other Ambulatory Visit: Payer: Self-pay | Admitting: *Deleted

## 2020-02-17 DIAGNOSIS — N6320 Unspecified lump in the left breast, unspecified quadrant: Secondary | ICD-10-CM

## 2020-02-17 DIAGNOSIS — N6310 Unspecified lump in the right breast, unspecified quadrant: Secondary | ICD-10-CM

## 2020-02-17 NOTE — Telephone Encounter (Signed)
   Katherine Werner DOB: [birth year] MRN: [id number]   RIDER WAIVER AND RELEASE OF LIABILITY  For purposes of improving physical access to our facilities, [facility] is pleased to partner with third parties to provide [facility] patients or other authorized individuals the option of convenient, on-demand ground transportation services (the Chiropractor") through use of the technology service that enables users to request on-demand ground transportation from independent third-party providers.  By opting to use and accept these [employer], I, the undersigned, hereby agree on behalf of myself, and on behalf of any minor child using the [employer] for whom I am the parent or legal guardian, as follows:  1. Science writer provided to me are provided by independent third-party transportation providers who are not [employer] or employees and who are unaffiliated with [employer]. 2. [facility] is neither a transportation carrier nor a common or public carrier. 3. [place] has no control over the quality or safety of the transportation that occurs as a result of the [employer]. 4. [facility] cannot guarantee that any third-party transportation provider will complete any arranged transportation service. 5. [facility] makes no representation, warranty, or guarantee regarding the reliability, timeliness, quality, safety, suitability, or availability of any of the Transport Services or that they will be error free. 6. I fully understand that traveling by vehicle involves risks and dangers of serious bodily injury, including permanent disability, paralysis, and death. I agree, on behalf of myself and on behalf of any minor child using the Transport Services for whom I am the parent or legal guardian, that the entire risk arising out of my use of the [employer] remains solely with me, to the maximum extent permitted under applicable law. 7. The [employer] are provided "as is" and "as available." [facility] disclaims all representations and warranties, express, implied or statutory, not expressly set out in these terms, including the implied warranties of merchantability and fitness for a particular purpose. 8. I hereby waive and release [facility], its agents, employees, officers, directors, representatives, insurers, attorneys, assigns, successors, subsidiaries, and affiliates from any and all past, present, or future claims, demands, liabilities, actions, causes of action, or suits of any kind directly or indirectly arising from acceptance and use of the [employer]. 9. I further waive and release [facility] and its affiliates from all present and future liability and responsibility for any injury or death to persons or damages to property caused by or related to the use of the [employer]. 10. I have read this Waiver and Release of Liability, and I understand the terms used in it and their legal significance. This Waiver is freely and voluntarily given with the understanding that my right (as well as the right of any minor child for whom I am the parent or legal guardian using the [employer]) to legal recourse against  in connection with the [employer] is knowingly surrendered in return for use of these services.   I attest that I read the consent document to Katherine Werner, gave Ms. Cartaya the opportunity to ask questions and answered the questions asked (if any). I affirm that Katherine Werner then provided consent for she's participation in this program.     Farley Ly

## 2020-02-20 ENCOUNTER — Other Ambulatory Visit (INDEPENDENT_AMBULATORY_CARE_PROVIDER_SITE_OTHER): Payer: Self-pay | Admitting: Primary Care

## 2020-02-20 MED ORDER — ATORVASTATIN CALCIUM 40 MG PO TABS: 40.0000 mg | ORAL_TABLET | Freq: Every day | ORAL | 3 refills | Status: DC

## 2020-02-21 MED FILL — ATORVASTATIN CALCIUM 40 MG: 40 | 30 days supply | Qty: 30 | Fill #0

## 2020-02-28 MED FILL — GABAPENTIN 100 MG CAPSULE: 100 | 30 days supply | Qty: 90 | Fill #1

## 2020-03-01 ENCOUNTER — Other Ambulatory Visit: Payer: Self-pay

## 2020-03-01 ENCOUNTER — Telehealth (INDEPENDENT_AMBULATORY_CARE_PROVIDER_SITE_OTHER): Payer: Medicaid Other | Admitting: Primary Care

## 2020-03-22 ENCOUNTER — Ambulatory Visit: Payer: Self-pay | Admitting: *Deleted

## 2020-03-22 ENCOUNTER — Ambulatory Visit
Admission: RE | Admit: 2020-03-22 | Discharge: 2020-03-22 | Disposition: A | Discharge status: Home / Self Care | Payer: Medicaid Other | Source: Ambulatory Visit | Attending: Obstetrics and Gynecology | Admitting: Obstetrics and Gynecology

## 2020-03-22 ENCOUNTER — Ambulatory Visit
Admission: RE | Admit: 2020-03-22 | Discharge: 2020-03-22 | Disposition: A | Discharge status: Home / Self Care | Payer: No Typology Code available for payment source | Source: Ambulatory Visit | Attending: Obstetrics and Gynecology | Admitting: Obstetrics and Gynecology

## 2020-03-22 ENCOUNTER — Other Ambulatory Visit: Payer: Self-pay

## 2020-03-22 VITALS — BP 116/54 | Temp 97.3°F | Wt 137.5 lb

## 2020-03-22 DIAGNOSIS — Z1239 Encounter for other screening for malignant neoplasm of breast: Secondary | ICD-10-CM

## 2020-03-22 DIAGNOSIS — N6320 Unspecified lump in the left breast, unspecified quadrant: Secondary | ICD-10-CM

## 2020-03-22 DIAGNOSIS — N6310 Unspecified lump in the right breast, unspecified quadrant: Secondary | ICD-10-CM

## 2020-03-22 DIAGNOSIS — N6313 Unspecified lump in the right breast, lower outer quadrant: Secondary | ICD-10-CM

## 2020-03-22 NOTE — Progress Notes (Signed)
Ms. Katherine Werner is a 54 y.o. female who presents to [facility] clinic today referred from the [facility] due to 3-month bilateral diagnostic mammogram was recommended for follow-up. Last diagnostic mammogram completed 01/09/2014.   Pap Smear: Pap smear not completed today. Last Pap smear was 10/10/2019 at [facility] clinic and was normal. Per patient has no history of an abnormal Pap smear. Last Pap smear result is available in Epic.   Physical exam: Breasts Breasts symmetrical. No skin abnormalities bilateral breasts. No nipple retraction bilateral breasts. No nipple discharge bilateral breasts. No lymphadenopathy. No lumps palpated left breast. Palpated a mobile lump within the right breast at 8 o'clock 2 cm from the nipple. No complaints of pain or tenderness on exam.      Pelvic/Bimanual Pap is not indicated today per BCCCP guidelines.   Smoking History: Patient is a current smoker. Discussed smoking cessation with patient. Referred to the [facility] Quitline and gave resources to the free smoking cessation classes at [facility].   Patient Navigation: Patient education provided. Access to services provided for patient through [facility] program. Transportation provided to [employer] appointment, Breast Center appointment and home.  Colorectal Cancer Screening: Per patient has never had colonoscopy completed. FIT Test completed 10/10/2019 that per patient was negative. No complaints today.    Breast and Cervical Cancer Risk Assessment: Patient does not have family history of breast cancer, known genetic mutations, or radiation treatment to the chest before age 3. Patient does not have history of cervical dysplasia, immunocompromised, or DES exposure in-utero.  Risk Assessment    Risk Scores      03/22/2020   Last edited by: Priscille Heidelberg, RN   5-year risk: 1.4 %   Lifetime risk: 7.8 %         A: BCCCP exam without pap smear No complaints.  P: Referred patient  to the [facility] of [facility] for a diagnostic mammogram per recommendation. Appointment scheduled Thursday, March 22, 2020 at 1130.  Priscille Heidelberg, RN 03/22/2020 9:58 AM

## 2020-03-22 NOTE — Patient Instructions (Signed)
Explained breast self awareness with Dorothy Puffer. Patient did not need a Pap smear today due to last Pap smear was 10/10/2019. Let her know BCCCP will cover Pap smears every 3 years unless has a history of abnormal Pap smears. Referred patient to the [facility] of [facility] for a diagnostic mammogram per recommendation. Appointment scheduled Thursday, March 22, 2020 at 1130. Patient aware of appointment and will be there. Dorothy Puffer verbalized understanding.  Myer Bohlman, Kathaleen Maser, RN 9:55 AM

## 2020-03-29 MED FILL — GABAPENTIN 100 MG CAPSULE: 100 | 30 days supply | Qty: 90 | Fill #2

## 2020-03-29 MED FILL — ?ATORVASTATIN 40MG TABLET: 40 | 30 days supply | Qty: 30 | Fill #6

## 2020-05-03 MED FILL — GABAPENTIN 100 MG CAPSULE: 100 | 30 days supply | Qty: 90 | Fill #3

## 2020-05-03 MED FILL — ?ATORVASTATIN 40MG TABLET: 40 | 30 days supply | Qty: 30 | Fill #7

## 2020-06-11 ENCOUNTER — Other Ambulatory Visit (INDEPENDENT_AMBULATORY_CARE_PROVIDER_SITE_OTHER): Payer: Self-pay | Admitting: Primary Care

## 2020-06-11 MED FILL — ?ATORVASTATIN 40MG TABLET: 40 | 30 days supply | Qty: 30 | Fill #8

## 2020-06-11 MED FILL — GABAPENTIN 100 MG CAPSULE: 100 | 30 days supply | Qty: 90 | Fill #0

## 2020-07-31 MED FILL — ?ATORVASTATIN 40MG TABLET: 40 | 30 days supply | Qty: 30 | Fill #9

## 2020-08-28 ENCOUNTER — Other Ambulatory Visit (INDEPENDENT_AMBULATORY_CARE_PROVIDER_SITE_OTHER): Payer: Self-pay | Admitting: Primary Care

## 2020-08-28 MED FILL — GABAPENTIN 100 MG CAPSULE: 100 | 30 days supply | Qty: 90 | Fill #0

## 2020-08-28 MED FILL — ATORVASTATIN CALCIUM 40 MG: 40 | 30 days supply | Qty: 30 | Fill #1

## 2020-09-03 IMAGING — MG DIGITAL DIAGNOSTIC BILAT W/ TOMO W/ CAD
6 of 10 series · 6 of 30 positions shown · non-contrast
Comparison: Previous exam(s).

CLINICAL DATA: 53-year-old female for delayed follow-up of
bilateral breast masses identified in 3333.

EXAM:
DIGITAL DIAGNOSTIC BILATERAL MAMMOGRAM WITH CAD AND TOMO
ULTRASOUND BILATERAL BREAST

[R CC synth-2D]
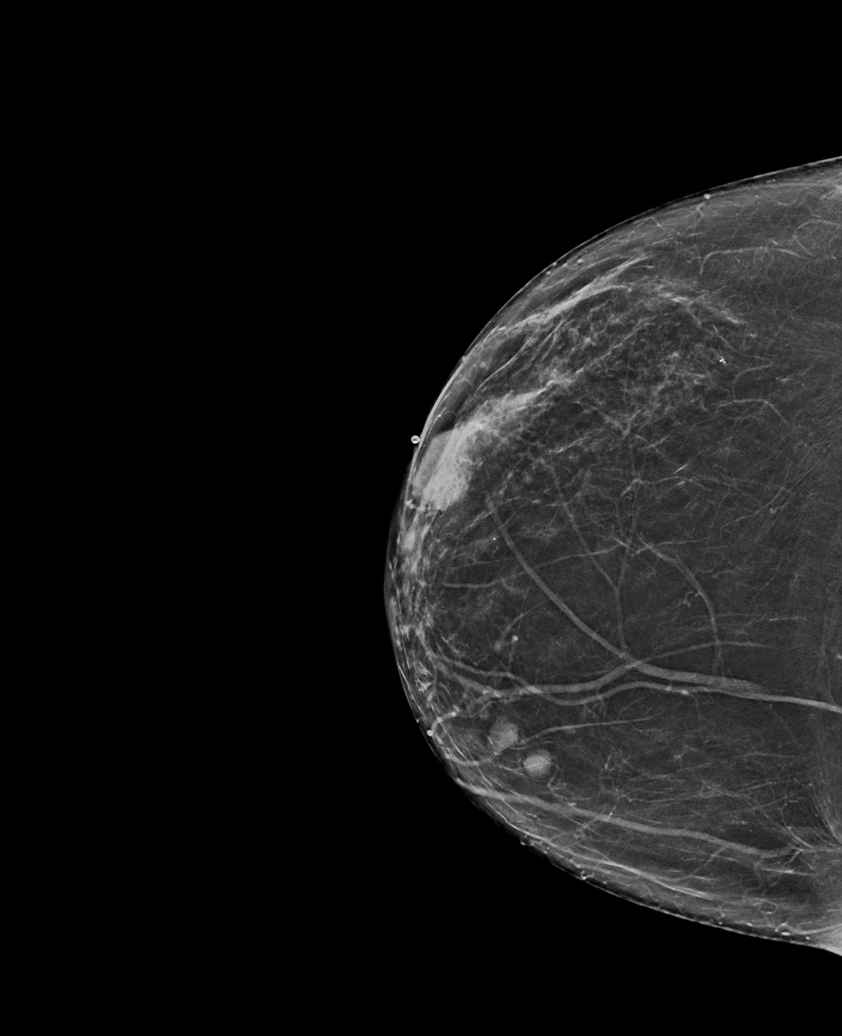

[R TAN synth-2D]
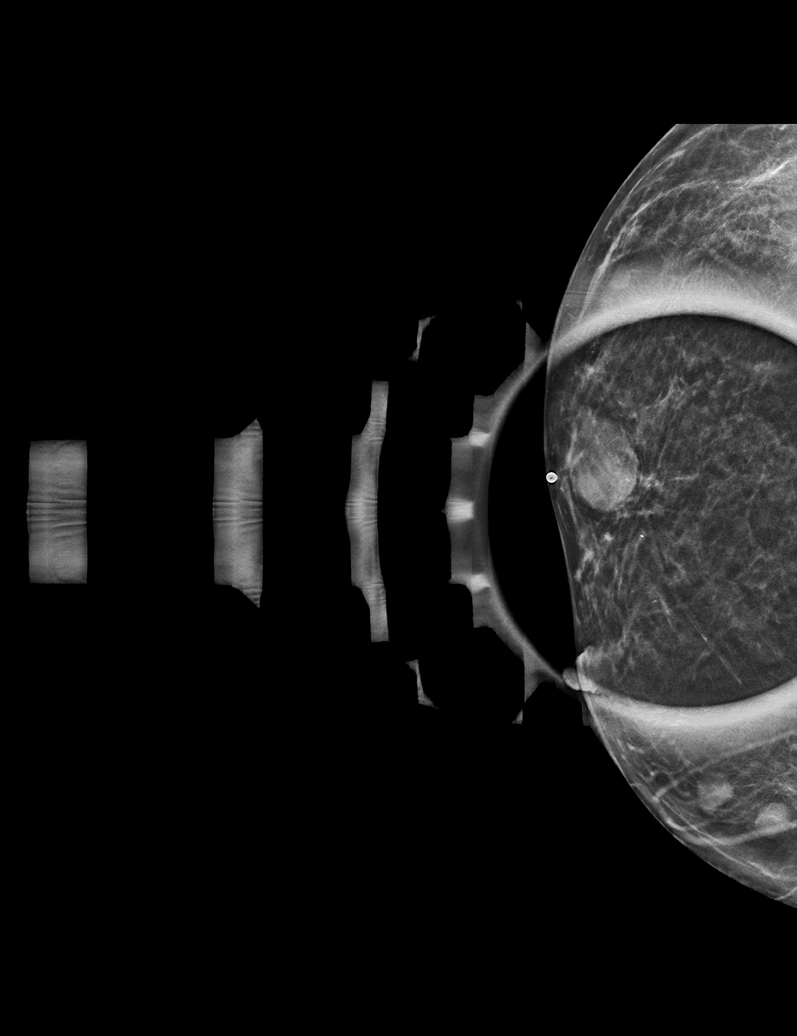

[L MLO synth-2D]
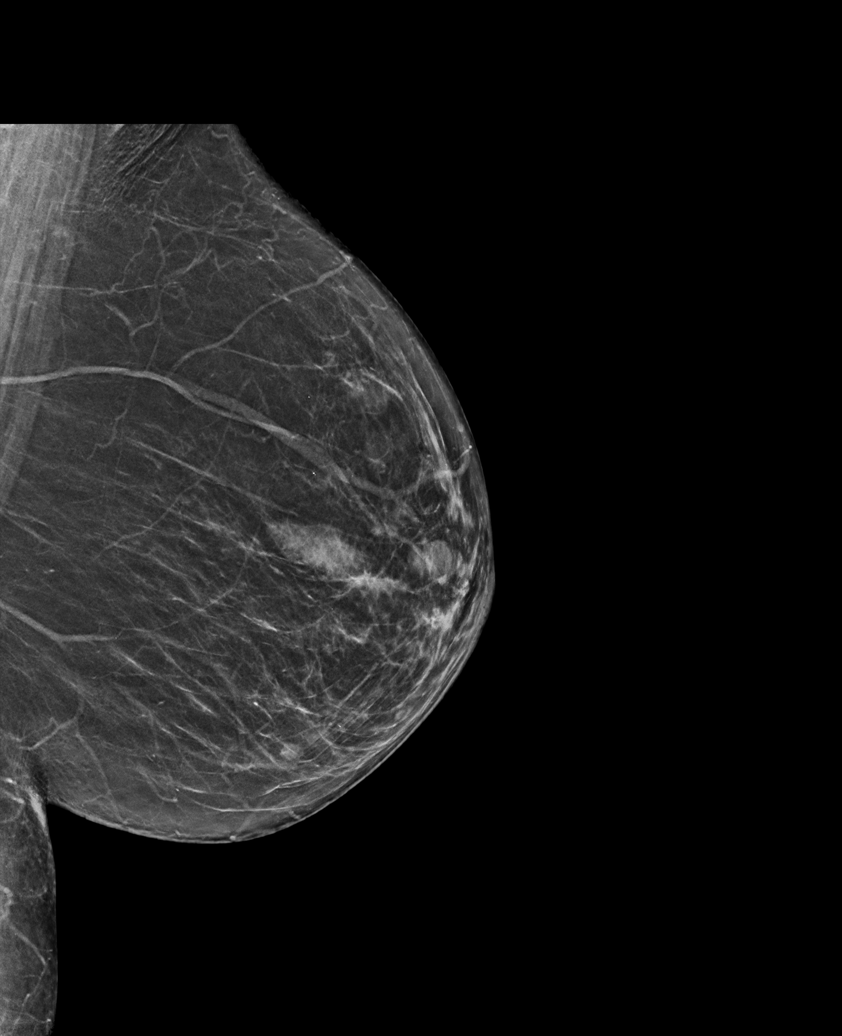

[L CC synth-2D]
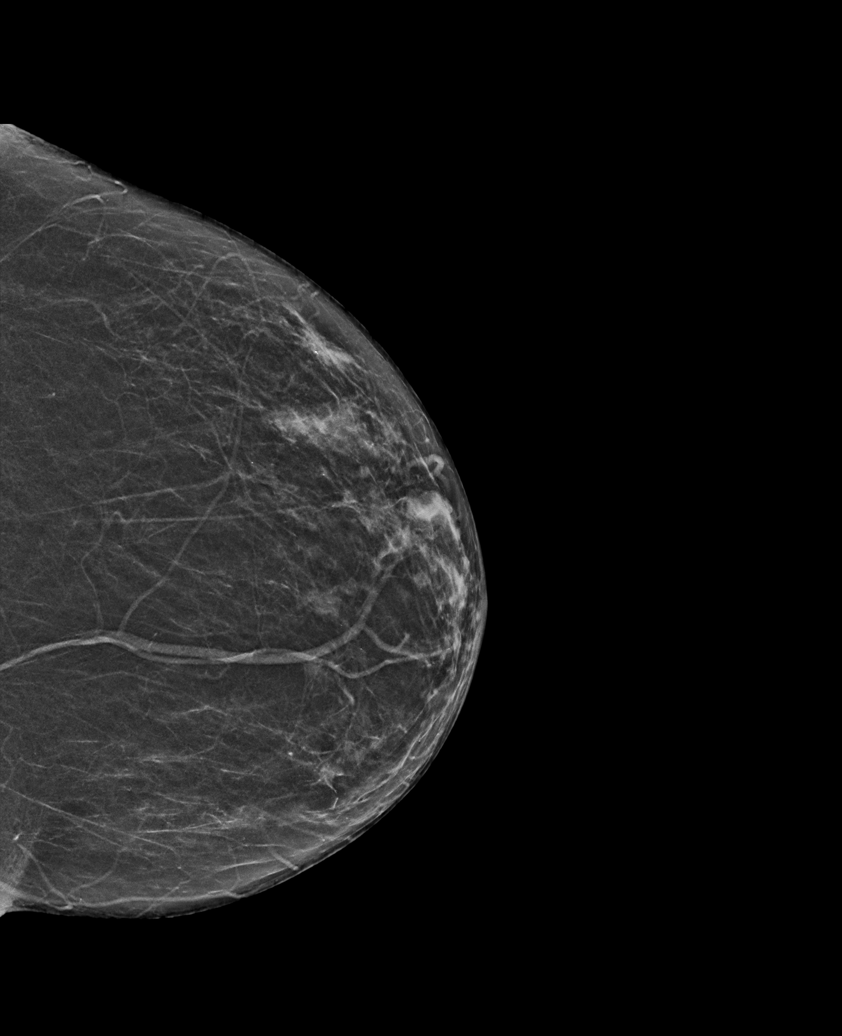

[R MLO synth-2D]
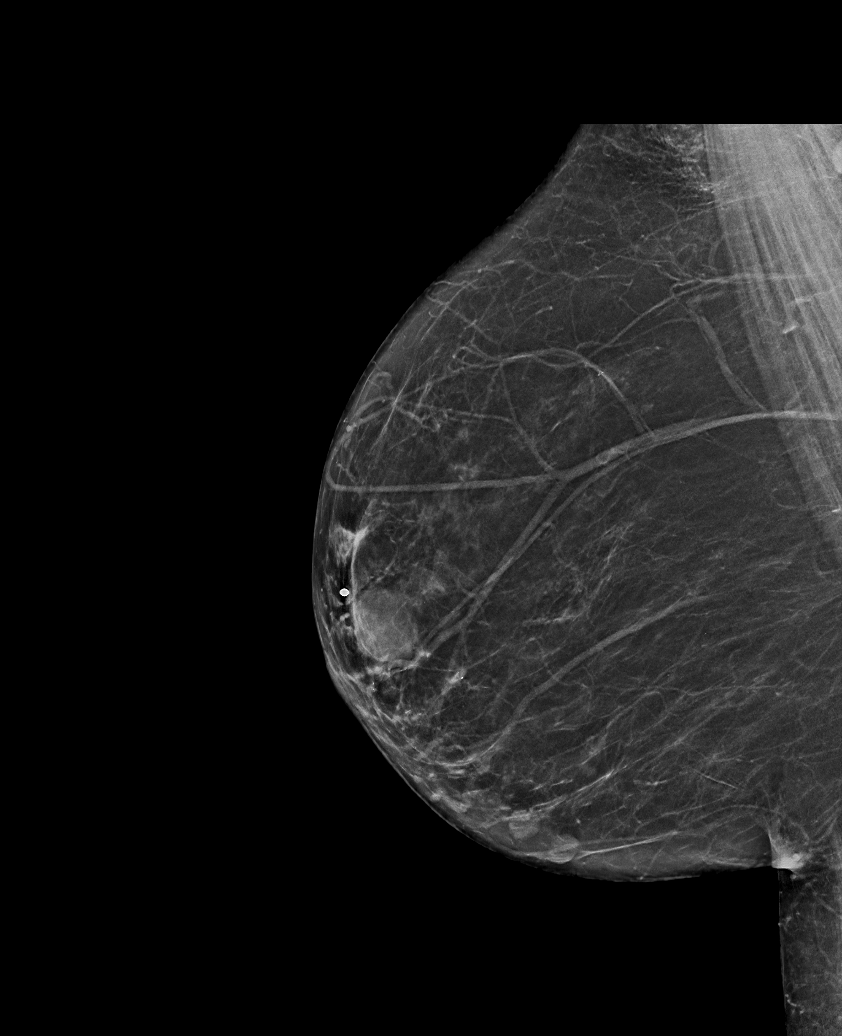

[R CC tomo · tomo slice 33/64.0]
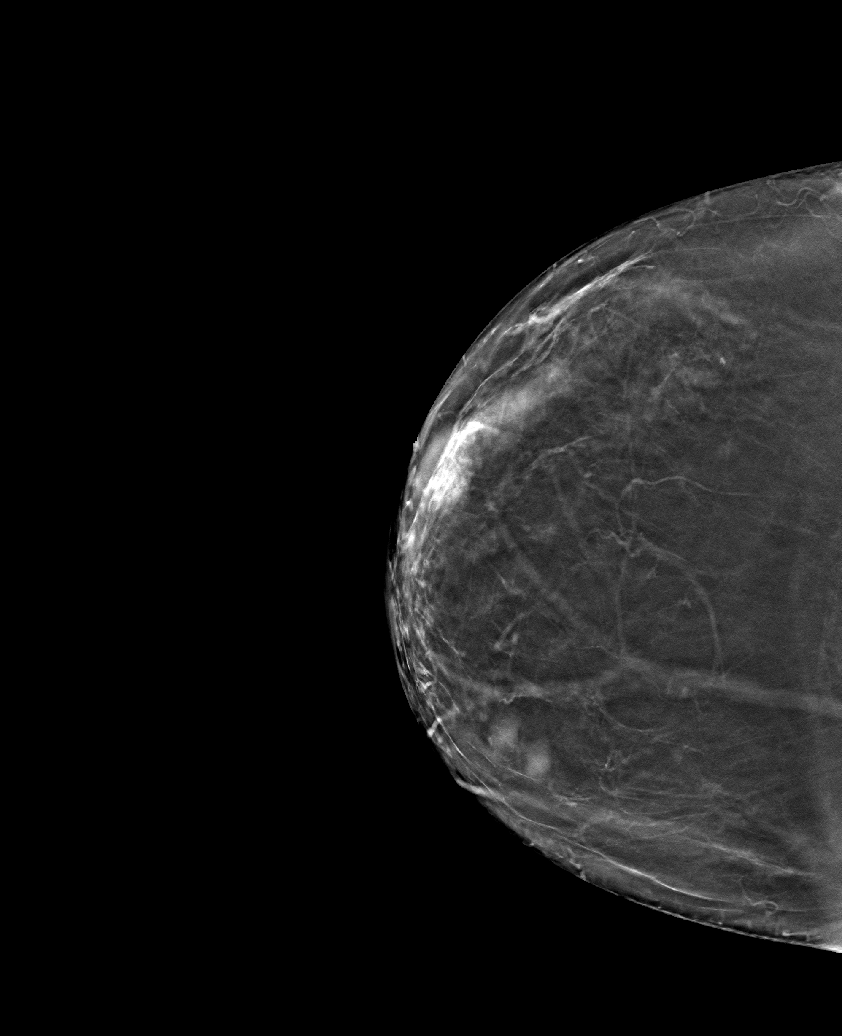

[6 of 30 positions shown; findings below may reference images not displayed]

ACR Breast Density Category b: There are scattered areas of
fibroglandular density.
FINDINGS: 2D/3D full field views of both breasts and spot compression view of
the RIGHT breast demonstrate bilateral circumscribed oval masses,
not significantly changed from 3333.

No suspicious mammographic findings are noted within either breast.

Mammographic images were processed with CAD.

Targeted ultrasound is performed, showing circumscribed oval
hypoechoic parallel masses within both breasts as follows:

RIGHT breast:

A 1.9 x 0.9 x 2 cm mass at the 10 o'clock position 5 cm from the
nipple

A 0.8 x 0.4 x 0.8 cm mass at the 5 o'clock position 5 cm from the
nipple

A 0.9 x 0.5 x 0.6 cm mass at the 4 o'clock position 3 cm from the
nipple

LEFT breast:

A 1 x 0.5 x 1.1 cm mass at the 5 o'clock position 2 cm from the
nipple.

The previously identified 8 o'clock position mass is no longer
visualized.
IMPRESSION: 1. Multiple circumscribed bilateral oval masses within both breasts,
compatible with fibroadenomas.
2. No suspicious findings within either breast.

RECOMMENDATION:
Bilateral screening mammogram in 1 year.

I have discussed the findings and recommendations with the patient.
If applicable, a reminder letter will be sent to the patient
regarding the next appointment.

BI-RADS CATEGORY  2: Benign.

## 2020-10-27 ENCOUNTER — Other Ambulatory Visit: Payer: Self-pay

## 2021-02-07 ENCOUNTER — Telehealth: Payer: Self-pay

## 2021-02-07 NOTE — Telephone Encounter (Signed)
Lmom for patient to call back and let us know if she would like to attend one of the mobile mammogram events 03/20/2021 or 04/17/2021 to have her mammogram done.

## 2021-03-08 ENCOUNTER — Encounter (INDEPENDENT_AMBULATORY_CARE_PROVIDER_SITE_OTHER): Payer: Self-pay | Admitting: Nurse Practitioner

## 2021-03-08 ENCOUNTER — Other Ambulatory Visit: Payer: Self-pay

## 2021-03-08 ENCOUNTER — Ambulatory Visit (INDEPENDENT_AMBULATORY_CARE_PROVIDER_SITE_OTHER): Payer: Self-pay | Admitting: Nurse Practitioner

## 2021-03-08 VITALS — BP 149/87 | HR 63 | Temp 97.5°F | Ht 63.0 in | Wt 139.8 lb

## 2021-03-08 DIAGNOSIS — E781 Pure hyperglyceridemia: Secondary | ICD-10-CM

## 2021-03-08 DIAGNOSIS — Z1211 Encounter for screening for malignant neoplasm of colon: Secondary | ICD-10-CM

## 2021-03-08 DIAGNOSIS — Z0000 Encounter for general adult medical examination without abnormal findings: Secondary | ICD-10-CM

## 2021-03-08 MED FILL — Gabapentin Cap 100 MG: ORAL | 30 days supply | Qty: 90 | Fill #0 | Status: AC

## 2021-03-08 MED FILL — Atorvastatin Calcium Tab 40 MG (Base Equivalent): ORAL | 30 days supply | Qty: 30 | Fill #0 | Status: AC

## 2021-03-08 NOTE — Assessment & Plan Note (Signed)
Routine physical:  Stay active  Stay well hydrated  Heart healthy low cholesterol diet  Continue current medications  Please call to schedule mammogram  Will ref to GI for colon cancer screening   Follow up:  Follow up in 6 months for follow up with Katherine Werner

## 2021-03-08 NOTE — Patient Instructions (Addendum)
Routine physical:  Stay active  Stay well hydrated  Heart healthy low cholesterol diet  Continue current medications  Please call to schedule mammogram  Will ref to GI for colon cancer screening   Follow up:  Follow up in 6 months for follow up with Katherine Werner    Health Maintenance, Female Adopting a healthy lifestyle and getting preventive care are important in promoting health and wellness. Ask your health care provider about: The right schedule for you to have regular tests and exams. Things you can do on your own to prevent diseases and keep yourself healthy. What should I know about diet, weight, and exercise? Eat a healthy diet  Eat a diet that includes plenty of vegetables, fruits, low-fat dairy products, and lean protein. Do not eat a lot of foods that are high in solid fats, added sugars, or sodium.  Maintain a healthy weight Body mass index (BMI) is used to identify weight problems. It estimates body fat based on height and weight. Your health care provider can help determineyour BMI and help you achieve or maintain a healthy weight. Get regular exercise Get regular exercise. This is one of the most important things you can do for your health. Most adults should: Exercise for at least 150 minutes each week. The exercise should increase your heart rate and make you sweat (moderate-intensity exercise). Do strengthening exercises at least twice a week. This is in addition to the moderate-intensity exercise. Spend less time sitting. Even light physical activity can be beneficial. Watch cholesterol and blood lipids Have your blood tested for lipids and cholesterol at 57 years of age, then havethis test every 5 years. Have your cholesterol levels checked more often if: Your lipid or cholesterol levels are high. You are older than 57 years of age. You are at high risk for heart disease. What should I know about cancer screening? Depending on your health history  and family history, you may need to have cancer screening at various ages. This may include screening for: Breast cancer. Cervical cancer. Colorectal cancer. Skin cancer. Lung cancer. What should I know about heart disease, diabetes, and high blood pressure? Blood pressure and heart disease High blood pressure causes heart disease and increases the risk of stroke. This is more likely to develop in people who have high blood pressure readings, are of African descent, or are overweight. Have your blood pressure checked: Every 3-5 years if you are 42-51 years of age. Every year if you are 9 years old or older. Diabetes Have regular diabetes screenings. This checks your fasting blood sugar level. Have the screening done: Once every three years after age 25 if you are at a normal weight and have a low risk for diabetes. More often and at a younger age if you are overweight or have a high risk for diabetes. What should I know about preventing infection? Hepatitis B If you have a higher risk for hepatitis B, you should be screened for this virus. Talk with your health care provider to find out if you are at risk forhepatitis B infection. Hepatitis C Testing is recommended for: Everyone born from [birth year] through 1967. Anyone with known risk factors for hepatitis C. Sexually transmitted infections (STIs) Get screened for STIs, including gonorrhea and chlamydia, if: You are sexually active and are younger than 57 years of age. You are older than 57 years of age and your health care provider tells you that you are at risk for this type of infection. Your sexual  activity has changed since you were last screened, and you are at increased risk for chlamydia or gonorrhea. Ask your health care provider if you are at risk. Ask your health care provider about whether you are at high risk for HIV. Your health care provider may recommend a prescription medicine to help prevent HIV infection. If you choose to  take medicine to prevent HIV, you should first get tested for HIV. You should then be tested every 3 months for as long as you are taking the medicine. Pregnancy If you are about to stop having your period (premenopausal) and you may become pregnant, seek counseling before you get pregnant. Take 400 to 800 micrograms (mcg) of folic acid every day if you become pregnant. Ask for birth control (contraception) if you want to prevent pregnancy. Osteoporosis and menopause Osteoporosis is a disease in which the bones lose minerals and strength with aging. This can result in bone fractures. If you are 63 years old or older, or if you are at risk for osteoporosis and fractures, ask your health care provider if you should: Be screened for bone loss. Take a calcium or vitamin D supplement to lower your risk of fractures. Be given hormone replacement therapy (HRT) to treat symptoms of menopause. Follow these instructions at home: Lifestyle Do not use any products that contain nicotine or tobacco, such as cigarettes, e-cigarettes, and chewing tobacco. If you need help quitting, ask your health care provider. Do not use street drugs. Do not share needles. Ask your health care provider for help if you need support or information about quitting drugs. Alcohol use Do not drink alcohol if: Your health care provider tells you not to drink. You are pregnant, may be pregnant, or are planning to become pregnant. If you drink alcohol: Limit how much you use to 0-1 drink a day. Limit intake if you are breastfeeding. Be aware of how much alcohol is in your drink. In the [place] one drink equals one 12 oz bottle of beer (355 mL), one 5 oz glass of wine (148 mL), or one 1 oz glass of hard liquor (44 mL). General instructions Schedule regular health, dental, and eye exams. Stay current with your vaccines. Tell your health care provider if: You often feel depressed. You have ever been abused or do not feel safe at  home. Summary Adopting a healthy lifestyle and getting preventive care are important in promoting health and wellness. Follow your health care provider's instructions about healthy diet, exercising, and getting tested or screened for diseases. Follow your health care provider's instructions on monitoring your cholesterol and blood pressure. This information is not intended to replace advice given to you by your health care provider. Make sure you discuss any questions you have with your healthcare provider. Document Revised: 07/07/2018 Document Reviewed: 07/07/2018 Elsevier Patient Education  2022 [employer].

## 2021-03-08 NOTE — Progress Notes (Signed)
@Patient  ID: , female    DOB: 12-06-1965, 55 y.o.   MRN: [id number]  Chief Complaint  Patient presents with   Annual Exam    Referring provider: [id number], NP   HPI  Patient presents today for routine physical.  Overall she states that she has been doing well.  She states that she is scheduled for mammogram this month.  She states that she has not had a colonoscopy.  We discussed that we can place a referral for GI for colon cancer screening.  Patient has no new issues or concerns in office today.  She is fasting today and will have routine blood work completed. Denies f/c/s, n/v/d, hemoptysis, PND, chest pain or edema.    No Known Allergies  Immunization History  Administered Date(s) Administered   Influenza,inj,Quad PF,6+ Mos 04/06/2017   Tdap 08/27/2016, 04/06/2017    Past Medical History:  Diagnosis Date   High cholesterol     Tobacco History: Social History   Tobacco Use  Smoking Status Every Day   Packs/day: 0.10   Types: Cigarettes  Smokeless Tobacco Never   Ready to quit: Not Answered Counseling given: Not Answered   Outpatient Encounter Medications as of 03/08/2021  Medication Sig   gabapentin (NEURONTIN) 100 MG capsule TAKE 1 CAPSULE (100 MG TOTAL) BY MOUTH 3 (THREE) TIMES DAILY.   hydrOXYzine (ATARAX/VISTARIL) 25 MG tablet Take 0.5-1 tablets (12.5-25 mg total) by mouth every 8 (eight) hours as needed for itching.   atorvastatin (LIPITOR) 40 MG tablet TAKE 1 TABLET (40 MG TOTAL) BY MOUTH DAILY.   [DISCONTINUED] atorvastatin (LIPITOR) 40 MG tablet TAKE 1 TABLET (40 MG TOTAL) BY MOUTH DAILY.   [DISCONTINUED] cetirizine (ZYRTEC) 10 MG tablet Take 10 mg by mouth daily as needed for allergies.   [DISCONTINUED] dicyclomine (BENTYL) 20 MG tablet Take 1 tablet (20 mg total) by mouth 2 (two) times daily.   [DISCONTINUED] gemfibrozil (LOPID) 600 MG tablet Take 1 tablet (600 mg total) by mouth 2 (two) times daily before a meal.    [DISCONTINUED] metroNIDAZOLE (FLAGYL) 500 MG tablet Take 1 tablet (500 mg total) by mouth 2 (two) times daily. (Patient not taking: Reported on 03/22/2020)   [DISCONTINUED] omeprazole (PRILOSEC) 20 MG capsule Take 1 capsule (20 mg total) by mouth daily.   No facility-administered encounter medications on file as of 03/08/2021.     Review of Systems  Review of Systems  Constitutional: Negative.   HENT: Negative.    Respiratory:  Negative for cough and shortness of breath.   Cardiovascular: Negative.   Gastrointestinal: Negative.   Allergic/Immunologic: Negative.   Neurological: Negative.   Psychiatric/Behavioral: Negative.        Physical Exam  BP (!) 149/87 (BP Location: Right Arm, Patient Position: Sitting, Cuff Size: Normal)   Pulse 63   Temp (!) 97.5 F (36.4 C) (Temporal)   Ht 5\' 3"  (1.6 m)   Wt 139 lb 12.8 oz (63.4 kg)   LMP 04/11/2013   SpO2 96%   BMI 24.76 kg/m   Wt Readings from Last 5 Encounters:  03/08/21 139 lb 12.8 oz (63.4 kg)  03/22/20 137 lb 8 oz (62.4 kg)  02/13/20 144 lb (65.3 kg)  10/10/19 143 lb (64.9 kg)  08/15/19 146 lb (66.2 kg)     Physical Exam Vitals and nursing note reviewed.  Constitutional:      General: She is not in acute distress.    Appearance: She is well-developed.  Cardiovascular:  Rate and Rhythm: Normal rate and regular rhythm.  Pulmonary:     Effort: Pulmonary effort is normal.     Breath sounds: Normal breath sounds.  Neurological:     Mental Status: She is alert and oriented to person, place, and time.      Assessment & Plan:   Hypertriglyceridemia Routine physical:  Stay active  Stay well hydrated  Heart healthy low cholesterol diet  Continue current medications  Please call to schedule mammogram  Will ref to GI for colon cancer screening   Follow up:  Follow up in 6 months for follow up with Gwinda Passe     Ivonne Andrew, NP 03/08/2021

## 2021-03-09 LAB — CMP14+EGFR
ALT: 11 IU/L (ref 0–32)
AST: 26 IU/L (ref 0–40)
Albumin/Globulin Ratio: 0.9 — ABNORMAL LOW (ref 1.2–2.2)
Albumin: 3.9 g/dL (ref 3.8–4.9)
Alkaline Phosphatase: 95 IU/L (ref 44–121)
BUN/Creatinine Ratio: 11 (ref 9–23)
BUN: 6 mg/dL (ref 6–24)
Bilirubin Total: 0.2 mg/dL (ref 0.0–1.2)
CO2: 21 mmol/L (ref 20–29)
Calcium: 9 mg/dL (ref 8.7–10.2)
Chloride: 101 mmol/L (ref 96–106)
Creatinine, Ser: 0.57 mg/dL (ref 0.57–1.00)
Globulin, Total: 4.2 g/dL (ref 1.5–4.5)
Glucose: 89 mg/dL (ref 65–99)
Potassium: 4.5 mmol/L (ref 3.5–5.2)
Sodium: 136 mmol/L (ref 134–144)
Total Protein: 8.1 g/dL (ref 6.0–8.5)
eGFR: 107 mL/min/{1.73_m2} (ref >59)

## 2021-03-09 LAB — CBC
Hematocrit: 37.6 % (ref 34.0–46.6)
Hemoglobin: 12.1 g/dL (ref 11.1–15.9)
MCH: 25.8 pg — ABNORMAL LOW (ref 26.6–33.0)
MCHC: 32.2 g/dL (ref 31.5–35.7)
MCV: 80 fL (ref 79–97)
Platelets: 255 10*3/uL (ref 150–450)
RBC: 4.69 x10E6/uL (ref 3.77–5.28)
RDW: 15.3 % (ref 11.7–15.4)
WBC: 5 10*3/uL (ref 3.4–10.8)

## 2021-03-09 LAB — LIPID PANEL
Chol/HDL Ratio: 3.6 ratio (ref 0.0–4.4)
Cholesterol, Total: 197 mg/dL (ref 100–199)
HDL: 55 mg/dL (ref >39)
LDL Chol Calc (NIH): 90 mg/dL (ref 0–99)
Triglycerides: 318 mg/dL — ABNORMAL HIGH (ref 0–149)
VLDL Cholesterol Cal: 52 mg/dL — ABNORMAL HIGH (ref 5–40)

## 2021-03-21 ENCOUNTER — Encounter (INDEPENDENT_AMBULATORY_CARE_PROVIDER_SITE_OTHER): Payer: Self-pay

## 2021-06-11 ENCOUNTER — Encounter: Payer: Self-pay | Admitting: Gastroenterology

## 2021-07-24 ENCOUNTER — Other Ambulatory Visit: Payer: Self-pay

## 2021-07-24 ENCOUNTER — Ambulatory Visit (AMBULATORY_SURGERY_CENTER): Payer: Self-pay

## 2021-07-24 VITALS — Ht 64.0 in | Wt 140.0 lb

## 2021-07-24 DIAGNOSIS — Z1211 Encounter for screening for malignant neoplasm of colon: Secondary | ICD-10-CM

## 2021-07-24 MED ORDER — NA SULFATE-K SULFATE-MG SULF 17.5-3.13-1.6 GM/177ML PO SOLN
1.0000 | Freq: Once | ORAL | 0 refills | Status: AC
  Filled 2021-07-24: qty 354, 14d supply, fill #0

## 2021-07-24 NOTE — Progress Notes (Signed)
Pre visit completed via phone; Patient verified name, DOB, and address; No egg or soy allergy known to patient  No issues known to pt with past sedation with any surgeries or procedures Patient denies ever being told they had issues or difficulty with intubation  No FH of Malignant Hyperthermia Pt is not on diet pills Pt is not on home 02  Pt is not on blood thinners  Pt denies issues with constipation; No A fib or A flutter Pt is fully vaccinated for Covid x 2; NO PA's for preps discussed with pt in [facility] today  Discussed with pt there will be an out-of-pocket cost for prep and that varies from $0 to 70 + dollars - pt verbalized understanding  Due to the COVID-19 pandemic we are asking patients to follow certain guidelines in [facility] and the [facility]   Pt aware of COVID protocols and [facility] guidelines

## 2021-07-25 ENCOUNTER — Other Ambulatory Visit: Payer: Self-pay

## 2021-07-25 MED FILL — Atorvastatin Calcium Tab 40 MG (Base Equivalent): ORAL | 30 days supply | Qty: 30 | Fill #1 | Status: AC

## 2021-07-25 MED FILL — Gabapentin Cap 100 MG: ORAL | 30 days supply | Qty: 90 | Fill #1 | Status: AC

## 2021-08-04 ENCOUNTER — Encounter: Payer: Self-pay | Admitting: Certified Registered Nurse Anesthetist

## 2021-08-05 ENCOUNTER — Ambulatory Visit (AMBULATORY_SURGERY_CENTER): Payer: Self-pay | Admitting: Gastroenterology

## 2021-08-05 ENCOUNTER — Encounter: Payer: Self-pay | Admitting: Gastroenterology

## 2021-08-05 VITALS — BP 193/94 | HR 79 | Temp 97.5°F | Resp 17 | Ht 64.0 in | Wt 140.0 lb

## 2021-08-05 DIAGNOSIS — Z1211 Encounter for screening for malignant neoplasm of colon: Secondary | ICD-10-CM

## 2021-08-05 MED ORDER — SODIUM CHLORIDE 0.9 % IV SOLN: 500.0000 mL | Freq: Once | INTRAVENOUS | Status: DC

## 2021-08-05 NOTE — Progress Notes (Signed)
1138 Robinul 0.2 mg IV given due large amount of secretions upon assessment.  MD made aware, vss

## 2021-08-05 NOTE — Op Note (Signed)
[facility] Patient Name: Katherine Werner Procedure Date: 08/05/2021 11:13 AM MRN: [id number] Endoscopist: Thornton Park MD, MD Age: 56 Referring MD:  Date of Birth: 1965/09/06 Gender: Female Account #: [id number] Procedure:                Colonoscopy Indications:              Screening for colorectal malignant neoplasm, This                            is the patient's first colonoscopy                           No known family history of colon cancer or polyps Medicines:                Monitored Anesthesia Care Procedure:                Pre-Anesthesia Assessment:                           - Prior to the procedure, a History and Physical                            was performed, and patient medications and                            allergies were reviewed. The patient's tolerance of                            previous anesthesia was also reviewed. The risks                            and benefits of the procedure and the sedation                            options and risks were discussed with the patient.                            All questions were answered, and informed consent                            was obtained. Prior Anticoagulants: The patient has                            taken no previous anticoagulant or antiplatelet                            agents. ASA Grade Assessment: II - A patient with                            mild systemic disease. After reviewing the risks                            and benefits, the patient was deemed in  satisfactory condition to undergo the procedure.                           After obtaining informed consent, the colonoscope                            was passed under direct vision. Throughout the                            procedure, the patient's blood pressure, pulse, and                            oxygen saturations were monitored continuously. The                            CF [id number] was  introduced through the anus                            and advanced to the the cecum, identified by                            appendiceal orifice and ileocecal valve. A second                            forward view of the right colon was performed. The                            colonoscopy was performed without difficulty. The                            patient tolerated the procedure well. The quality                            of the bowel preparation was good. The ileocecal                            valve, appendiceal orifice, and rectum were                            photographed. Scope In: 11:29:50 AM Scope Out: 11:42:42 AM Scope Withdrawal Time: 0 hours 9 minutes 14 seconds  Total Procedure Duration: 0 hours 12 minutes 52 seconds  Findings:                 The perianal and digital rectal examinations were                            normal.                           Non-bleeding internal hemorrhoids were found.                           A few small-mouthed diverticula were found in the  sigmoid colon.                           The exam was otherwise without abnormality on                            direct and retroflexion views. Complications:            No immediate complications. Estimated blood loss:                            Minimal. Estimated Blood Loss:     Estimated blood loss: none. Impression:               - Non-bleeding internal hemorrhoids.                           - Diverticulosis in the sigmoid colon.                           - The examination was otherwise normal on direct                            and retroflexion views. Recommendation:           - Patient has a contact number available for                            emergencies. The signs and symptoms of potential                            delayed complications were discussed with the                            patient. Return to normal activities tomorrow.                             Written discharge instructions were provided to the                            patient.                           - High fiber diet.                           - Continue present medications.                           - Repeat colonoscopy in 10 years for surveillance,                            earlier with new symptoms.                           - Emerging evidence supports eating a diet of  fruits, vegetables, grains, calcium, and yogurt                            while reducing red meat and alcohol may reduce the                            risk of colon cancer.                           - Thank you for allowing me to be involved in your                            colon cancer prevention. Thornton Park MD, MD 08/05/2021 11:46:16 AM This report has been signed electronically.

## 2021-08-05 NOTE — Progress Notes (Signed)
° °  Referring Provider: Fenton Foy, NP Primary Care Physician:  Kerin Perna, NP  Reason for Procedure:  Colon cancer screening   IMPRESSION:  Need for colon cancer screening Appropriate candidate for monitored anesthesia care  PLAN: Colonoscopy in the [facility] today   HPI: Katherine Werner is a 56 y.o. female presents for screening colonoscopy.  No prior colonoscopy or colon cancer screening.  No baseline GI symptoms.   No known family history of colon cancer or polyps. No family history of uterine/endometrial cancer, pancreatic cancer or gastric/stomach cancer.   Past Medical History:  Diagnosis Date   High cholesterol    on meds    Past Surgical History:  Procedure Laterality Date   TUBAL LIGATION  2000    Current Outpatient Medications  Medication Sig Dispense Refill   atorvastatin (LIPITOR) 40 MG tablet TAKE 1 TABLET (40 MG TOTAL) BY MOUTH DAILY. 90 tablet 0   gabapentin (NEURONTIN) 100 MG capsule TAKE 1 CAPSULE (100 MG TOTAL) BY MOUTH 3 (THREE) TIMES DAILY. 90 capsule 3   Current Facility-Administered Medications  Medication Dose Route Frequency Provider Last Rate Last Admin   0.9 %  sodium chloride infusion  500 mL Intravenous Once Thornton Park, MD        Allergies as of 08/05/2021   (No Known Allergies)    Family History  Problem Relation Age of Onset   Diabetes Mother    Colon cancer Neg Hx    Esophageal cancer Neg Hx    Stomach cancer Neg Hx    Rectal cancer Neg Hx      Physical Exam: General:   Alert,  well-nourished, pleasant and cooperative in NAD Head:  Normocephalic and atraumatic. Eyes:  Sclera clear, no icterus.   Conjunctiva pink. Mouth:  No deformity or lesions.   Neck:  Supple; no masses or thyromegaly. Lungs:  Clear throughout to auscultation.   No wheezes. Heart:  Regular rate and rhythm; no murmurs. Abdomen:  Soft, non-tender, nondistended, normal bowel sounds, no rebound or guarding.  Msk:  Symmetrical. No boney  deformities LAD: No inguinal or umbilical LAD Extremities:  No clubbing or edema. Neurologic:  Alert and  oriented x4;  grossly nonfocal Skin:  No obvious rash or bruise. Psych:  Alert and cooperative. Normal mood and affect.     Studies/Results: No results found.    Sheena Simonis L. Tarri Glenn, MD, MPH 08/05/2021, 11:23 AM

## 2021-08-05 NOTE — Progress Notes (Signed)
Vitals-DT  Pt's states no medical or surgical changes since previsit or office visit.   Patient ate yesterday at 4 pm.  States that her bm was brown liquid with no stool in it.  States can see to bottom of bowl. Pt informed regarding rescheduling if necessary.

## 2021-08-05 NOTE — Patient Instructions (Signed)
Information on diverticulosis and hemorrhoids given to you today. Resume previous diet and medications.  Repeat colonoscopy in 10 years.  YOU HAD AN ENDOSCOPIC PROCEDURE TODAY AT THE [facility]:   Refer to the procedure report that was given to you for any specific questions about what was found during the examination.  If the procedure report does not answer your questions, please call your gastroenterologist to clarify.  If you requested that your care partner not be given the details of your procedure findings, then the procedure report has been included in a sealed envelope for you to review at your convenience later.  YOU SHOULD EXPECT: Some feelings of bloating in the abdomen. Passage of more gas than usual.  Walking can help get rid of the air that was put into your GI tract during the procedure and reduce the bloating. If you had a lower endoscopy (such as a colonoscopy or flexible sigmoidoscopy) you may notice spotting of blood in your stool or on the toilet paper. If you underwent a bowel prep for your procedure, you may not have a normal bowel movement for a few days.  Please Note:  You might notice some irritation and congestion in your nose or some drainage.  This is from the oxygen used during your procedure.  There is no need for concern and it should clear up in a day or so.  SYMPTOMS TO REPORT IMMEDIATELY:  Following lower endoscopy (colonoscopy or flexible sigmoidoscopy):  Excessive amounts of blood in the stool  Significant tenderness or worsening of abdominal pains  Swelling of the abdomen that is new, acute  Fever of 100F or higher   For urgent or emergent issues, a gastroenterologist can be reached at any hour by calling [phone]. Do not use MyChart messaging for urgent concerns.    DIET:  We do recommend a small meal at first, but then you may proceed to your regular diet.  Drink plenty of fluids but you should avoid alcoholic beverages for 24  hours.  ACTIVITY:  You should plan to take it easy for the rest of today and you should NOT DRIVE or use heavy machinery until tomorrow (because of the sedation medicines used during the test).    FOLLOW UP: Our staff will call the number listed on your records 48-72 hours following your procedure to check on you and address any questions or concerns that you may have regarding the information given to you following your procedure. If we do not reach you, we will leave a message.  We will attempt to reach you two times.  During this call, we will ask if you have developed any symptoms of COVID 19. If you develop any symptoms (ie: fever, flu-like symptoms, shortness of breath, cough etc.) before then, please call [phone].  If you test positive for Covid 19 in the 2 weeks post procedure, please call and report this information to us.    If any biopsies were taken you will be contacted by phone or by letter within the next 1-3 weeks.  Please call us at [phone] if you have not heard about the biopsies in 3 weeks.    SIGNATURES/CONFIDENTIALITY: You and/or your care partner have signed paperwork which will be entered into your electronic medical record.  These signatures attest to the fact that that the information above on your After Visit Summary has been reviewed and is understood.  Full responsibility of the confidentiality of this discharge information lies with you and/or   your care-partner.  

## 2021-08-05 NOTE — Progress Notes (Signed)
Report given to PACU, vss 

## 2021-08-07 ENCOUNTER — Telehealth: Payer: Self-pay

## 2021-08-07 NOTE — Telephone Encounter (Signed)
Called [phone] and left a message we tried to reach pt for a follow up call. maw

## 2021-08-08 ENCOUNTER — Telehealth: Payer: Self-pay

## 2021-08-08 NOTE — Telephone Encounter (Signed)
°  Follow up Call-  Call back number 08/05/2021  Post procedure Call Back phone  # [phone]  Permission to leave phone message Yes  Some recent data might be hidden     Patient questions:  Do you have a fever, pain , or abdominal swelling? No. Pain Score  0 *  Have you tolerated food without any problems? Yes.    Have you been able to return to your normal activities? Yes.    Do you have any questions about your discharge instructions: Diet   No. Medications  No. Follow up visit  No.  Do you have questions or concerns about your Care? No.  Actions: * If pain score is 4 or above: No action needed, pain <4.  Have you developed a fever since your procedure? no  2.   Have you had an respiratory symptoms (SOB or cough) since your procedure? no  3.   Have you tested positive for COVID 19 since your procedure no  4.   Have you had any family members/close contacts diagnosed with the COVID 19 since your procedure?  no   If yes to any of these questions please route to Laverna Peace, RN and Karlton Lemon, RN

## 2022-06-19 ENCOUNTER — Other Ambulatory Visit: Payer: Self-pay

## 2022-06-19 ENCOUNTER — Emergency Department (HOSPITAL_COMMUNITY): Payer: Commercial Managed Care - HMO

## 2022-06-19 ENCOUNTER — Emergency Department (HOSPITAL_COMMUNITY)
Admission: EM | Admit: 2022-06-19 | Discharge: 2022-06-19 | Disposition: A | Discharge status: Home / Self Care | Payer: Commercial Managed Care - HMO | Attending: Emergency Medicine | Admitting: Emergency Medicine

## 2022-06-19 ENCOUNTER — Encounter (HOSPITAL_COMMUNITY): Payer: Self-pay

## 2022-06-19 DIAGNOSIS — J209 Acute bronchitis, unspecified: Secondary | ICD-10-CM | POA: Diagnosis not present

## 2022-06-19 DIAGNOSIS — R059 Cough, unspecified: Secondary | ICD-10-CM | POA: Diagnosis present

## 2022-06-19 DIAGNOSIS — Z1152 Encounter for screening for COVID-19: Secondary | ICD-10-CM | POA: Insufficient documentation

## 2022-06-19 LAB — SARS CORONAVIRUS 2 BY RT PCR: SARS Coronavirus 2 by RT PCR: NEGATIVE

## 2022-06-19 MED ORDER — DOXYCYCLINE HYCLATE 100 MG PO TABS
100.0000 mg | ORAL_TABLET | Freq: Once | ORAL | Status: AC
  Administered 2022-06-19: 100 mg via ORAL
  Filled 2022-06-19: qty 1

## 2022-06-19 MED ORDER — ALBUTEROL SULFATE HFA 108 (90 BASE) MCG/ACT IN AERS
2.0000 | INHALATION_SPRAY | Freq: Once | RESPIRATORY_TRACT | Status: AC
  Administered 2022-06-19: 2 via RESPIRATORY_TRACT
  Filled 2022-06-19: qty 6.7

## 2022-06-19 MED ORDER — PREDNISONE 20 MG PO TABS
60.0000 mg | ORAL_TABLET | Freq: Once | ORAL | Status: AC
  Administered 2022-06-19: 60 mg via ORAL
  Filled 2022-06-19: qty 3

## 2022-06-19 MED ORDER — IPRATROPIUM-ALBUTEROL 0.5-2.5 (3) MG/3ML IN SOLN
3.0000 mL | RESPIRATORY_TRACT | Status: AC
  Administered 2022-06-19 (×3): 3 mL via RESPIRATORY_TRACT
  Filled 2022-06-19: qty 9

## 2022-06-19 MED ORDER — ALBUTEROL SULFATE (2.5 MG/3ML) 0.083% IN NEBU: 3.0000 mL | INHALATION_SOLUTION | RESPIRATORY_TRACT | Status: DC | PRN

## 2022-06-19 MED ORDER — PREDNISONE 20 MG PO TABS: ORAL_TABLET | ORAL | 0 refills | Status: DC

## 2022-06-19 MED ORDER — AEROCHAMBER Z-STAT PLUS/MEDIUM MISC
1.0000 | Freq: Once | Status: AC
  Administered 2022-06-19: 1
  Filled 2022-06-19: qty 1

## 2022-06-19 MED ORDER — DOXYCYCLINE HYCLATE 100 MG PO CAPS: 100.0000 mg | ORAL_CAPSULE | Freq: Two times a day (BID) | ORAL | 0 refills | Status: DC

## 2022-06-19 MED ORDER — HYDROCODONE BIT-HOMATROP MBR 5-1.5 MG/5ML PO SOLN
5.0000 mL | Freq: Once | ORAL | Status: AC
  Administered 2022-06-19: 5 mL via ORAL
  Filled 2022-06-19: qty 5

## 2022-06-19 NOTE — ED Provider Notes (Signed)
[facility] EMERGENCY DEPARTMENT Provider Note   CSN: [id number] Arrival date & time: 06/19/22  7106     History  Chief Complaint  Patient presents with   Cough    Katherine Werner is a 56 y.o. female.  56 yo F here with 6-7 days of a cough, fever, headache, insomnia. States she has been coughing up white phlegm and subsequently green sputum. No sick exposures. Mucinex doesn't help much. Long time smoker. Never dx w/ COPD.   Also states a new soft area of swelling on right forehead over the last year.    Cough      Home Medications Prior to Admission medications   Medication Sig Start Date End Date Taking? Authorizing Provider  doxycycline (VIBRAMYCIN) 100 MG capsule Take 1 capsule (100 mg total) by mouth 2 (two) times daily. One po bid x 7 days 06/19/22  Yes Jacobie Stamey, Barbara Cower, MD  predniSONE (DELTASONE) 20 MG tablet 2 tabs po daily x 4 days 06/19/22  Yes Izetta Sakamoto, Barbara Cower, MD  atorvastatin (LIPITOR) 40 MG tablet TAKE 1 TABLET (40 MG TOTAL) BY MOUTH DAILY. 08/28/20 08/28/21  Grayce Sessions, NP  gabapentin (NEURONTIN) 100 MG capsule TAKE 1 CAPSULE (100 MG TOTAL) BY MOUTH 3 (THREE) TIMES DAILY. 08/28/20 08/28/21  Grayce Sessions, NP  cetirizine (ZYRTEC) 10 MG tablet Take 10 mg by mouth daily as needed for allergies.  08/03/19  [provider]  dicyclomine (BENTYL) 20 MG tablet Take 1 tablet (20 mg total) by mouth 2 (two) times daily. 11/29/14 08/03/19  Palumbo, April, MD  gemfibrozil (LOPID) 600 MG tablet Take 1 tablet (600 mg total) by mouth 2 (two) times daily before a meal. 04/07/17 08/03/19  Loletta Specter, PA-C  omeprazole (PRILOSEC) 20 MG capsule Take 1 capsule (20 mg total) by mouth daily. 11/29/14 08/03/19  Palumbo, April, MD      Allergies    Patient has no known allergies.    Review of Systems   Review of Systems  Respiratory:  Positive for cough.     Physical Exam Updated Vital Signs BP 136/87 (BP Location: Left Arm)   Pulse 68   Temp 98.8 F  (37.1 C) (Oral)   Resp 16   Ht 5' (1.524 m)   Wt 61.2 kg   LMP 04/11/2013   SpO2 100%   BMI 26.37 kg/m  Physical Exam Vitals and nursing note reviewed.  Constitutional:      Appearance: She is well-developed.  HENT:     Head: Normocephalic and atraumatic.     Mouth/Throat:     Mouth: Mucous membranes are moist.  Eyes:     Pupils: Pupils are equal, round, and reactive to light.  Cardiovascular:     Rate and Rhythm: Normal rate and regular rhythm.  Pulmonary:     Effort: No respiratory distress.     Breath sounds: Decreased air movement present. No stridor.  Abdominal:     General: Abdomen is flat. There is no distension.  Musculoskeletal:     Cervical back: Normal range of motion.  Skin:    General: Skin is warm and dry.  Neurological:     General: No focal deficit present.     Mental Status: She is alert. Mental status is at baseline.     ED Results / Procedures / Treatments   Labs (all labs ordered are listed, but only abnormal results are displayed) Labs Reviewed  SARS CORONAVIRUS 2 BY RT PCR    EKG None  Radiology DG Chest 2 View  Result Date: 06/19/2022 CLINICAL DATA:  Shortness of breath. EXAM: CHEST - 2 VIEW COMPARISON:  09/06/2017 FINDINGS: Stable cardiac enlargement. No sign of pleural effusion. Pulmonary vascular congestion. No frank interstitial edema or pleural effusion. Atelectasis noted within both lung bases. No airspace opacities. IMPRESSION: 1. Cardiac enlargement and pulmonary vascular congestion. 2. Bibasilar atelectasis. Electronically Signed   By: Signa Kell M.D.   On: 06/19/2022 06:29    Procedures Procedures    Medications Ordered in ED Medications  ipratropium-albuterol (DUONEB) 0.5-2.5 (3) MG/3ML nebulizer solution 3 mL (3 mLs Nebulization Given 06/19/22 0557)  predniSONE (DELTASONE) tablet 60 mg (60 mg Oral Given 06/19/22 0651)  HYDROcodone bit-homatropine (HYCODAN) 5-1.5 MG/5ML syrup 5 mL (5 mLs Oral Given 06/19/22 0651)   doxycycline (VIBRA-TABS) tablet 100 mg (100 mg Oral Given 06/19/22 0736)  albuterol (VENTOLIN HFA) 108 (90 Base) MCG/ACT inhaler 2 puff (2 puffs Inhalation Given 06/19/22 0743)  aerochamber Z-Stat Plus/medium 1 each (1 each Other Given 06/19/22 0743)    ED Course/ Medical Decision Making/ A&P                           Medical Decision Making Amount and/or Complexity of Data Reviewed Labs: ordered. Radiology: ordered.  Risk Prescription drug management.  Likely bronchitis. XR interpreted by myself as not having PTX or obvious consolidation, pending Rads read. Covid pending. Will treat with albuterol/prednisone/abx in meantime.  XR with patchy opacities near hilum. Rads read states increased pulmonary vasculature and cardiac silhouette. Coughing and breathing improved,  she can be d/c on bronchitis treatments. Considere evaluating for HF but I think the findings on cxr are more c/w with bronchitis and possibly pneumonia rather than heart failure as she has no other PE findings for CHF or edema and had vast improvement with medications provided here.   Final Clinical Impression(s) / ED Diagnoses Final diagnoses:  Acute bronchitis, unspecified organism    Rx / DC Orders ED Discharge Orders          Ordered    predniSONE (DELTASONE) 20 MG tablet        06/19/22 0703    doxycycline (VIBRAMYCIN) 100 MG capsule  2 times daily        06/19/22 0703              Markisha Meding, Barbara Cower, MD 06/19/22 2318

## 2022-06-19 NOTE — ED Triage Notes (Signed)
Pt to ED pov, ambulatory to triage.  Pt c/o productive cough, nasal congestion, headache, fevers, and unable to sleep x 6 days.  Pt has taken mucinex w/o relief.

## 2022-07-09 ENCOUNTER — Other Ambulatory Visit: Payer: Self-pay

## 2022-07-09 ENCOUNTER — Emergency Department (HOSPITAL_COMMUNITY): Payer: Commercial Managed Care - HMO

## 2022-07-09 ENCOUNTER — Emergency Department (HOSPITAL_COMMUNITY)
Admission: EM | Admit: 2022-07-09 | Discharge: 2022-07-09 | Disposition: A | Discharge status: Home / Self Care | Payer: Commercial Managed Care - HMO | Attending: Emergency Medicine | Admitting: Emergency Medicine

## 2022-07-09 ENCOUNTER — Encounter (HOSPITAL_COMMUNITY): Payer: Self-pay | Admitting: Emergency Medicine

## 2022-07-09 DIAGNOSIS — N3000 Acute cystitis without hematuria: Secondary | ICD-10-CM | POA: Insufficient documentation

## 2022-07-09 DIAGNOSIS — R0789 Other chest pain: Secondary | ICD-10-CM | POA: Insufficient documentation

## 2022-07-09 DIAGNOSIS — D72829 Elevated white blood cell count, unspecified: Secondary | ICD-10-CM | POA: Insufficient documentation

## 2022-07-09 LAB — TROPONIN I (HIGH SENSITIVITY)
Troponin I (High Sensitivity): 3 ng/L (ref <18)
Troponin I (High Sensitivity): 3 ng/L (ref <18)

## 2022-07-09 LAB — CBC WITH DIFFERENTIAL/PLATELET
Abs Immature Granulocytes: 0.02 10*3/uL (ref 0.00–0.07)
Basophils Absolute: 0 10*3/uL (ref 0.0–0.1)
Basophils Relative: 1 %
Eosinophils Absolute: 0.1 10*3/uL (ref 0.0–0.5)
Eosinophils Relative: 1 %
HCT: 35.5 % — ABNORMAL LOW (ref 36.0–46.0)
Hemoglobin: 11.7 g/dL — ABNORMAL LOW (ref 12.0–15.0)
Immature Granulocytes: 1 %
Lymphocytes Relative: 18 %
Lymphs Abs: 0.8 10*3/uL (ref 0.7–4.0)
MCH: 24.9 pg — ABNORMAL LOW (ref 26.0–34.0)
MCHC: 33 g/dL (ref 30.0–36.0)
MCV: 75.5 fL — ABNORMAL LOW (ref 80.0–100.0)
Monocytes Absolute: 0.7 10*3/uL (ref 0.1–1.0)
Monocytes Relative: 17 %
Neutro Abs: 2.6 10*3/uL (ref 1.7–7.7)
Neutrophils Relative %: 62 %
Platelets: 228 10*3/uL (ref 150–400)
RBC: 4.7 MIL/uL (ref 3.87–5.11)
RDW: 14 % (ref 11.5–15.5)
WBC: 4.2 10*3/uL (ref 4.0–10.5)
nRBC: 0 % (ref 0.0–0.2)

## 2022-07-09 LAB — COMPREHENSIVE METABOLIC PANEL
ALT: 19 U/L (ref 0–44)
AST: 44 U/L — ABNORMAL HIGH (ref 15–41)
Albumin: 3.4 g/dL — ABNORMAL LOW (ref 3.5–5.0)
Alkaline Phosphatase: 63 U/L (ref 38–126)
Anion gap: 11 (ref 5–15)
BUN: <5 mg/dL — ABNORMAL LOW (ref 6–20)
CO2: 25 mmol/L (ref 22–32)
Calcium: 8.9 mg/dL (ref 8.9–10.3)
Chloride: 96 mmol/L — ABNORMAL LOW (ref 98–111)
Creatinine, Ser: 0.82 mg/dL (ref 0.44–1.00)
GFR, Estimated: >60 mL/min (ref >60)
Glucose, Bld: 110 mg/dL — ABNORMAL HIGH (ref 70–99)
Potassium: 3.8 mmol/L (ref 3.5–5.1)
Sodium: 132 mmol/L — ABNORMAL LOW (ref 135–145)
Total Bilirubin: 0.5 mg/dL (ref 0.3–1.2)
Total Protein: 7.4 g/dL (ref 6.5–8.1)

## 2022-07-09 LAB — URINALYSIS, ROUTINE W REFLEX MICROSCOPIC
Bacteria, UA: NONE SEEN
Bilirubin Urine: NEGATIVE
Glucose, UA: NEGATIVE mg/dL
Hgb urine dipstick: NEGATIVE
Ketones, ur: NEGATIVE mg/dL
Nitrite: NEGATIVE
Protein, ur: NEGATIVE mg/dL
Specific Gravity, Urine: 1.018 (ref 1.005–1.030)
pH: 5 (ref 5.0–8.0)

## 2022-07-09 LAB — LIPASE, BLOOD: Lipase: 28 U/L (ref 11–51)

## 2022-07-09 MED ORDER — KETOROLAC TROMETHAMINE 15 MG/ML IJ SOLN
15.0000 mg | Freq: Once | INTRAMUSCULAR | Status: AC
  Administered 2022-07-09: 15 mg via INTRAMUSCULAR
  Filled 2022-07-09: qty 1

## 2022-07-09 MED ORDER — CEPHALEXIN 500 MG PO CAPS
500.0000 mg | ORAL_CAPSULE | Freq: Four times a day (QID) | ORAL | 0 refills | Status: AC
  Filled 2022-07-09: qty 28, 7d supply, fill #0

## 2022-07-09 NOTE — Discharge Instructions (Addendum)
Your workup today was overall reassuring.  No concerning cause of your chest pain.  Your urine did show some signs of infection.  We will send this for urine culture to confirm if there is a true infection.  I have sent antibiotics into the pharmacy to treat your infection at this time.  For worsening chest pain, shortness of breath return to the emergency room otherwise follow-up with your primary care provider.  For your chest pain have also given you a follow-up with cardiology.  They will call to schedule this appointment.  If you do not hear from them in the next 2 days please call their clinic to schedule this appointment.

## 2022-07-09 NOTE — ED Provider Notes (Signed)
[facility] EMERGENCY DEPARTMENT Provider Note   CSN: [id number] Arrival date & time: 07/09/22  [id number]     History  Chief Complaint  Patient presents with   Chest Pain    Katherine EAVENSON is a 56 y.o. female.   Pt complains of chest pain that is located under her left breast that started last night while patient was resting and attempting to sleep after work.  Denies any activity that brought this on.  Denies shortness of breath, lightheadedness, palpitations.  States it radiates to the epigastric region.  Denies associated nausea, vomiting, dysuria, hematuria.  Also wraps around to left flank.  Since arriving to the emergency room patient reports improvement in the pain.  This is not pleuritic in nature.  Denies recent travel by flight or long road trip, prior history of DVT or PE.  Denies leg pain or leg swelling.  The history is provided by the patient. No language interpreter was used.       Home Medications Prior to Admission medications   Medication Sig Start Date End Date Taking? Authorizing Provider  atorvastatin (LIPITOR) 40 MG tablet TAKE 1 TABLET (40 MG TOTAL) BY MOUTH DAILY. 08/28/20 08/28/21  Kerin Perna, NP  doxycycline (VIBRAMYCIN) 100 MG capsule Take 1 capsule (100 mg total) by mouth 2 (two) times daily. One po bid x 7 days 06/19/22   Mesner, Corene Cornea, MD  gabapentin (NEURONTIN) 100 MG capsule TAKE 1 CAPSULE (100 MG TOTAL) BY MOUTH 3 (THREE) TIMES DAILY. 08/28/20 08/28/21  Kerin Perna, NP  predniSONE (DELTASONE) 20 MG tablet 2 tabs po daily x 4 days 06/19/22   Mesner, Corene Cornea, MD  cetirizine (ZYRTEC) 10 MG tablet Take 10 mg by mouth daily as needed for allergies.  08/03/19  [provider]  dicyclomine (BENTYL) 20 MG tablet Take 1 tablet (20 mg total) by mouth 2 (two) times daily. 11/29/14 08/03/19  Palumbo, April, MD  gemfibrozil (LOPID) 600 MG tablet Take 1 tablet (600 mg total) by mouth 2 (two) times daily before a meal. 04/07/17 08/03/19  Clent Demark, PA-C  omeprazole (PRILOSEC) 20 MG capsule Take 1 capsule (20 mg total) by mouth daily. 11/29/14 08/03/19  Palumbo, April, MD      Allergies    Patient has no known allergies.    Review of Systems   Review of Systems  Constitutional:  Negative for chills and fever.  Respiratory:  Negative for cough and shortness of breath.   Cardiovascular:  Positive for chest pain.  Gastrointestinal:  Negative for abdominal pain, nausea and vomiting.  Neurological:  Negative for syncope and light-headedness.  All other systems reviewed and are negative.   Physical Exam Updated Vital Signs BP (!) 142/75   Pulse 64   Temp 98.7 F (37.1 C) (Oral)   Resp 18   Ht 5' (1.524 m)   Wt 61 kg   LMP 04/11/2013   SpO2 100%   BMI 26.26 kg/m  Physical Exam Vitals and nursing note reviewed.  Constitutional:      General: She is not in acute distress.    Appearance: Normal appearance. She is not ill-appearing.  HENT:     Head: Normocephalic and atraumatic.     Nose: Nose normal.  Eyes:     General: No scleral icterus.    Extraocular Movements: Extraocular movements intact.     Conjunctiva/sclera: Conjunctivae normal.  Cardiovascular:     Rate and Rhythm: Normal rate and regular rhythm.  Pulses: Normal pulses.  Pulmonary:     Effort: Pulmonary effort is normal. No respiratory distress.     Breath sounds: Normal breath sounds. No wheezing or rales.  Abdominal:     General: There is no distension.     Tenderness: There is no abdominal tenderness.  Musculoskeletal:        General: Normal range of motion.     Cervical back: Normal range of motion.     Right lower leg: No edema.     Left lower leg: No edema.  Skin:    General: Skin is warm and dry.  Neurological:     General: No focal deficit present.     Mental Status: She is alert. Mental status is at baseline.    ED Results / Procedures / Treatments   Labs (all labs ordered are listed, but only abnormal results are  displayed) Labs Reviewed  CBC WITH DIFFERENTIAL/PLATELET - Abnormal; Notable for the following components:      Result Value   Hemoglobin 11.7 (*)    HCT 35.5 (*)    MCV 75.5 (*)    MCH 24.9 (*)    All other components within normal limits  COMPREHENSIVE METABOLIC PANEL - Abnormal; Notable for the following components:   Sodium 132 (*)    Chloride 96 (*)    Glucose, Bld 110 (*)    BUN <5 (*)    Albumin 3.4 (*)    AST 44 (*)    All other components within normal limits  URINALYSIS, ROUTINE W REFLEX MICROSCOPIC - Abnormal; Notable for the following components:   APPearance HAZY (*)    Leukocytes,Ua LARGE (*)    All other components within normal limits  LIPASE, BLOOD  TROPONIN I (HIGH SENSITIVITY)  TROPONIN I (HIGH SENSITIVITY)    EKG EKG Interpretation  Date/Time:  Wednesday July 09 2022 09:13:32 EST Ventricular Rate:  64 PR Interval:  138 QRS Duration: 68 QT Interval:  422 QTC Calculation: 435 R Axis:   58 Text Interpretation: Normal sinus rhythm ST & T wave abnormality, consider inferior ischemia ST & T wave abnormality, consider anterior ischemia Abnormal ECG When compared with ECG of 19-Jun-2022 05:14, PREVIOUS ECG IS PRESENT Simmiar to prior Confirmed by Nanda Quinton [phone]) on 07/09/2022 9:38:20 AM  Radiology DG Chest 2 View  Result Date: 07/09/2022 CLINICAL DATA:  chest pain EXAM: CHEST - 2 VIEW COMPARISON:  06/19/2022 FINDINGS: The mediastinal contours are within normal limits. Unchanged mild cardiomegaly. Similar appearing cephalization of pulmonary vasculature. The lungs are otherwise clear bilaterally without evidence of focal consolidation, pleural effusion, or pneumothorax. No acute osseous abnormality. IMPRESSION: Similar appearing pulmonary vascular prominence without evidence of pulmonary edema. Unchanged mild cardiomegaly. Electronically Signed   By: Ruthann Cancer M.D.   On: 07/09/2022 10:07    Procedures Procedures    Medications Ordered in  ED Medications  ketorolac (TORADOL) 15 MG/ML injection 15 mg (has no administration in time range)    ED Course/ Medical Decision Making/ A&P                           Medical Decision Making Amount and/or Complexity of Data Reviewed Labs: ordered. Radiology: ordered.  Risk Prescription drug management.   Medical Decision Making / ED Course   This patient presents to the ED for concern of chest pain, this involves an extensive number of treatment options, and is a complaint that carries with it a high risk of  complications and morbidity.  The differential diagnosis includes ACS, PE, pneumonia, MSK pain, pyelo  MDM: 56 year old female presents today for evaluation of above-mentioned complaint.  Overall she is well-appearing.  Chest pain is not reproducible.  Denies associated shortness of breath, lightheadedness, palpitations.  Denies prior history of MI or CAD.  Denies nausea, vomiting, other abdominal pain.  She does state that the pain wraps around to her left flank.  Initial workup included ACS workup as well as abdominal pain labs.  CBC showed no leukocytosis, hemoglobin around patient's baseline at 11.7.  CMP was grossly unremarkable without acute concerns.  Lipase within normal limits.  Initial troponin of 3.  Repeat pending.  UA shows large leukocytes but no nitrite.  11-20 WBCs.  Will send urine culture, will start patient on antibiotics.  Heart score of 3.  However given the atypical chest pain and nonspecific EKG changes will refer to cardiology.   Lab Tests: -I ordered, reviewed, and interpreted labs.   The pertinent results include:   Labs Reviewed  CBC WITH DIFFERENTIAL/PLATELET - Abnormal; Notable for the following components:      Result Value   Hemoglobin 11.7 (*)    HCT 35.5 (*)    MCV 75.5 (*)    MCH 24.9 (*)    All other components within normal limits  COMPREHENSIVE METABOLIC PANEL - Abnormal; Notable for the following components:   Sodium 132 (*)     Chloride 96 (*)    Glucose, Bld 110 (*)    BUN <5 (*)    Albumin 3.4 (*)    AST 44 (*)    All other components within normal limits  URINALYSIS, ROUTINE W REFLEX MICROSCOPIC - Abnormal; Notable for the following components:   APPearance HAZY (*)    Leukocytes,Ua LARGE (*)    All other components within normal limits  URINE CULTURE  LIPASE, BLOOD  TROPONIN I (HIGH SENSITIVITY)  TROPONIN I (HIGH SENSITIVITY)      EKG  EKG Interpretation  Date/Time:  Wednesday July 09 2022 09:13:32 EST Ventricular Rate:  64 PR Interval:  138 QRS Duration: 68 QT Interval:  422 QTC Calculation: 435 R Axis:   58 Text Interpretation: Normal sinus rhythm ST & T wave abnormality, consider inferior ischemia ST & T wave abnormality, consider anterior ischemia Abnormal ECG When compared with ECG of 19-Jun-2022 05:14, PREVIOUS ECG IS PRESENT Simmiar to prior Confirmed by Nanda Quinton [phone]) on 07/09/2022 9:38:20 AM         Imaging Studies ordered: I ordered imaging studies including chest x-ray I independently visualized and interpreted imaging. I agree with the radiologist interpretation   Medicines ordered and prescription drug management: Meds ordered this encounter  Medications   ketorolac (TORADOL) 15 MG/ML injection 15 mg    -I have reviewed the patients home medicines and have made adjustments as needed  Reevaluation: After the interventions noted above, I reevaluated the patient and found that they have :improved  Co morbidities that complicate the patient evaluation  Past Medical History:  Diagnosis Date   High cholesterol    on meds      Dispostion: Patient discharged in stable condition.  Return precaution discussed.  Patient voices understanding and is in agreement with plan.  Outpatient cardiology referral given for atypical chest pain.  Final Clinical Impression(s) / ED Diagnoses Final diagnoses:  Atypical chest pain  Acute cystitis without hematuria    Rx /  DC Orders ED Discharge Orders  Ordered    cephALEXin (KEFLEX) 500 MG capsule  4 times daily        07/09/22 1834    Ambulatory referral to Cardiology       Comments: If you have not heard from the Cardiology office within the next 72 hours please call [phone].   07/09/22 1835              Marita Kansas, PA-C 07/09/22 1836    Gwyneth Sprout, MD 07/09/22 2206

## 2022-07-09 NOTE — ED Triage Notes (Signed)
Pt arrives via EMS from home with pain to left rib pain since last night after work. Pt reports mild SOB, nausea and not feeling well.

## 2022-07-09 NOTE — ED Provider Triage Note (Signed)
Emergency Medicine Provider Triage Evaluation Note  Katherine Werner , a 56 y.o. female  was evaluated in triage.  Pt complains of chest pain that is located under her left breast that started last night while patient was resting and attempting to sleep after work.  Denies any activity that brought this on.  Denies shortness of breath, lightheadedness, palpitations.  States it radiates to the epigastric region.  Denies associated nausea, vomiting, dysuria, dysuria.  Also wraps around to left flank.   Review of Systems  Positive: As above Negative: As above  Physical Exam  BP 134/63 (BP Location: Left Arm)   Pulse 64   Temp 98.5 F (36.9 C) (Oral)   Resp 16   Ht 5' (1.524 m)   Wt 61 kg   LMP 04/11/2013   SpO2 99%   BMI 26.26 kg/m  Gen:   Awake, no distress   Resp:  Normal effort  MSK:   Moves extremities without difficulty  Other:    Medical Decision Making  Medically screening exam initiated at 9:19 AM.  Appropriate orders placed.  Katherine Werner was informed that the remainder of the evaluation will be completed by another provider, this initial triage assessment does not replace that evaluation, and the importance of remaining in the ED until their evaluation is complete.     Marita Kansas, PA-C 07/09/22 [phone]

## 2022-07-10 ENCOUNTER — Other Ambulatory Visit: Payer: Self-pay | Admitting: Emergency Medicine

## 2022-07-10 ENCOUNTER — Other Ambulatory Visit (INDEPENDENT_AMBULATORY_CARE_PROVIDER_SITE_OTHER): Payer: Self-pay | Admitting: Primary Care

## 2022-07-10 ENCOUNTER — Other Ambulatory Visit: Payer: Self-pay

## 2022-07-14 ENCOUNTER — Other Ambulatory Visit: Payer: Self-pay

## 2022-07-23 ENCOUNTER — Encounter: Payer: Self-pay | Admitting: Cardiovascular Disease

## 2022-07-23 ENCOUNTER — Ambulatory Visit: Payer: Commercial Managed Care - HMO | Attending: Cardiovascular Disease | Admitting: Cardiovascular Disease

## 2022-07-23 VITALS — BP 132/70 | HR 54 | Ht 64.0 in | Wt 137.0 lb

## 2022-07-23 DIAGNOSIS — R0789 Other chest pain: Secondary | ICD-10-CM | POA: Diagnosis not present

## 2022-07-23 DIAGNOSIS — E782 Mixed hyperlipidemia: Secondary | ICD-10-CM | POA: Diagnosis not present

## 2022-07-23 DIAGNOSIS — R9431 Abnormal electrocardiogram [ECG] [EKG]: Secondary | ICD-10-CM

## 2022-07-23 NOTE — Assessment & Plan Note (Signed)
Anterolateral T wave inversion which is old and present on EKGs dating back over 10 years.

## 2022-07-23 NOTE — Progress Notes (Signed)
07/23/2022 Katherine Werner   1966-01-14  [id number]  Primary Physician Grayce Sessions, NP Primary Cardiologist: Runell Gess MD Nicholes Calamity, [place]  HPI:  Katherine Werner is a 56 y.o. mildly overweight single African-American female mother of 5 children, grandmother of 15 grandchildren who works at [employer].  She was referred by the emergency room for atypical chest pain.  Her cardiac risk factor profile is notable for 20 pack years of tobacco abuse smoking 1/2 pack/day until recently when she is back down to 1 to 2 cigarettes a day after her recent ER visit.  She has treated hyperlipidemia.  There is no family history for heart disease.  She is never had heart attack or stroke.  She has no other medical conditions.  She was seen in the ER 07/09/2022 with pleuritic chest pain.  Her evaluation was unrevealing.  Her enzymes were negative.  Chest x-ray showed no active disease.  Her pain resolved with Toradol IM.  She did have anterolateral T wave inversion on her EKG but there is an unchanged from prior EKGs.  She was discharged home on antibiotic and has had no recurrent symptoms.   Current Meds  Medication Sig   predniSONE (DELTASONE) 20 MG tablet 2 tabs po daily x 4 days     No Known Allergies  Social History   Socioeconomic History   Marital status: Single    Spouse name: Not on file   Number of children: Not on file   Years of education: Not on file   Highest education level: 11th grade  Occupational History   Not on file  Tobacco Use   Smoking status: Every Day    Packs/day: 0.10    Types: Cigarettes   Smokeless tobacco: Never  Vaping Use   Vaping Use: Never used  Substance and Sexual Activity   Alcohol use: Yes    Alcohol/week: 3.0 standard drinks of alcohol    Types: 3 Standard drinks or equivalent per week    Comment: occ   Drug use: Never   Sexual activity: Not Currently    Birth control/protection: Surgical  Other Topics Concern   Not on  file  Social History Narrative   Not on file   Social Determinants of Health   Financial Resource Strain: Not on file  Food Insecurity: Not on file  Transportation Needs: Unmet Transportation Needs (03/22/2020)   PRAPARE - Administrator, Civil Service (Medical): Yes    Lack of Transportation (Non-Medical): Yes  Physical Activity: Not on file  Stress: Not on file  Social Connections: Not on file  Intimate Partner Violence: Not on file     Review of Systems: General: negative for chills, fever, night sweats or weight changes.  Cardiovascular: negative for chest pain, dyspnea on exertion, edema, orthopnea, palpitations, paroxysmal nocturnal dyspnea or shortness of breath Dermatological: negative for rash Respiratory: negative for cough or wheezing Urologic: negative for hematuria Abdominal: negative for nausea, vomiting, diarrhea, bright red blood per rectum, melena, or hematemesis Neurologic: negative for visual changes, syncope, or dizziness All other systems reviewed and are otherwise negative except as noted above.    Blood pressure 132/70, pulse (!) 54, height 5\' 4"  (1.626 m), weight 137 lb (62.1 kg), last menstrual period 04/11/2013, SpO2 99 %.  General appearance: alert and no distress Neck: no adenopathy, no carotid bruit, no JVD, supple, symmetrical, trachea midline, and thyroid not enlarged, symmetric, no tenderness/mass/nodules Lungs: clear to  auscultation bilaterally Heart: regular rate and rhythm, S1, S2 normal, no murmur, click, rub or gallop Extremities: extremities normal, atraumatic, no cyanosis or edema Pulses: 2+ and symmetric Skin: Skin color, texture, turgor normal. No rashes or lesions Neurologic: Grossly normal  EKG sinus bradycardia 54 with anterolateral T wave inversion.  I personally reviewed this EKG.  ASSESSMENT AND PLAN:   Hyperlipidemia History of hyperlipidemia on statin therapy with lipid profile performed 03/08/2021 revealing total  cholesterol 197, LDL 90 and HDL 55.  Atypical chest pain Ms. Loria was referred by the emergency room for recent evaluation of atypical chest pain 07/09/2022.  Her ischemic evaluation was unremarkable.  Her troponins were negative.  She did have anterior T wave inversion but this is unchanged from prior EKGs.  She was treated initially with Toradol which improved her chest pain was discharged home on antibiotic.  She has had no recurrent symptoms.  Her pain was somewhat pleuritic.  I am going to get a coronary calcium score to her stratify her.  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     Runell Gess MD FACP,FACC,FAHA, [facility] 07/23/2022 10:29 AM

## 2022-07-23 NOTE — Assessment & Plan Note (Signed)
Katherine Werner was referred by the emergency room for recent evaluation of atypical chest pain 07/09/2022.  Her ischemic evaluation was unremarkable.  Her troponins were negative.  She did have anterior T wave inversion but this is unchanged from prior EKGs.  She was treated initially with Toradol which improved her chest pain was discharged home on antibiotic.  She has had no recurrent symptoms.  Her pain was somewhat pleuritic.  I am going to get a coronary calcium score to her stratify her.

## 2022-07-23 NOTE — Assessment & Plan Note (Signed)
History of hyperlipidemia on statin therapy with lipid profile performed 03/08/2021 revealing total cholesterol 197, LDL 90 and HDL 55.

## 2022-07-23 NOTE — Patient Instructions (Signed)
Medication Instructions:  Your physician recommends that you continue on your current medications as directed. Please refer to the Current Medication list given to you today.  *If you need a refill on your cardiac medications before your next appointment, please call your pharmacy*   Testing/Procedures: Dr. Berry has ordered a CT coronary calcium score.   Test locations:  [facility]   This is $99 out of pocket.   Coronary CalciumScan A coronary calcium scan is an imaging test used to look for deposits of calcium and other fatty materials (plaques) in the inner lining of the blood vessels of the heart (coronary arteries). These deposits of calcium and plaques can partly clog and narrow the coronary arteries without producing any symptoms or warning signs. This puts a person at risk for a heart attack. This test can detect these deposits before symptoms develop. Tell a health care provider about: Any allergies you have. All medicines you are taking, including vitamins, herbs, eye drops, creams, and over-the-counter medicines. Any problems you or family members have had with anesthetic medicines. Any blood disorders you have. Any surgeries you have had. Any medical conditions you have. Whether you are pregnant or may be pregnant. What are the risks? Generally, this is a safe procedure. However, problems may occur, including: Harm to a pregnant woman and her unborn baby. This test involves the use of radiation. Radiation exposure can be dangerous to a pregnant woman and her unborn baby. If you are pregnant, you generally should not have this procedure done. Slight increase in the risk of cancer. This is because of the radiation involved in the test. What happens before the procedure? No preparation is needed for this procedure. What happens during the procedure? You will undress and remove any jewelry around your neck or chest. You will put on a hospital  gown. Sticky electrodes will be placed on your chest. The electrodes will be connected to an electrocardiogram (ECG) machine to record a tracing of the electrical activity of your heart. A CT scanner will take pictures of your heart. During this time, you will be asked to lie still and hold your breath for 2-3 seconds while a picture of your heart is being taken. The procedure may vary among health care providers and hospitals. What happens after the procedure? You can get dressed. You can return to your normal activities. It is up to you to get the results of your test. Ask your health care provider, or the department that is doing the test, when your results will be ready. Summary A coronary calcium scan is an imaging test used to look for deposits of calcium and other fatty materials (plaques) in the inner lining of the blood vessels of the heart (coronary arteries). Generally, this is a safe procedure. Tell your health care provider if you are pregnant or may be pregnant. No preparation is needed for this procedure. A CT scanner will take pictures of your heart. You can return to your normal activities after the scan is done. This information is not intended to replace advice given to you by your health care provider. Make sure you discuss any questions you have with your health care provider. Document Released: 01/10/2008 Document Revised: 06/02/2016 Document Reviewed: 06/02/2016 Elsevier Interactive Patient Education  2017 Elsevier Inc.    Follow-Up: At [facility], you and your health needs are our priority.  As part of our continuing mission to provide you with exceptional heart care, we have created   designated Provider Care Teams.  These Care Teams include your primary Cardiologist (physician) and Advanced Practice Providers (APPs -  Physician Assistants and Nurse Practitioners) who all work together to provide you with the care you need, when you need it.  We recommend  signing up for the patient portal called "MyChart".  Sign up information is provided on this After Visit Summary.  MyChart is used to connect with patients for Virtual Visits (Telemedicine).  Patients are able to view lab/test results, encounter notes, upcoming appointments, etc.  Non-urgent messages can be sent to your provider as well.   To learn more about what you can do with MyChart, go to [email or web address].    Your next appointment:   We will see you on an as needed basis.  Provider:   Jonathan Berry, MD  

## 2022-07-23 NOTE — Progress Notes (Signed)
ekg 

## 2022-08-19 ENCOUNTER — Ambulatory Visit (HOSPITAL_BASED_OUTPATIENT_CLINIC_OR_DEPARTMENT_OTHER): Payer: Commercial Managed Care - HMO | Attending: Cardiovascular Disease

## 2023-09-08 ENCOUNTER — Ambulatory Visit (INDEPENDENT_AMBULATORY_CARE_PROVIDER_SITE_OTHER): Payer: Self-pay | Admitting: Primary Care

## 2023-09-08 ENCOUNTER — Other Ambulatory Visit: Payer: Self-pay

## 2023-09-08 ENCOUNTER — Encounter (INDEPENDENT_AMBULATORY_CARE_PROVIDER_SITE_OTHER): Payer: Self-pay | Admitting: Primary Care

## 2023-09-08 VITALS — BP 181/77 | HR 60 | Resp 16 | Ht 63.0 in | Wt 142.2 lb

## 2023-09-08 DIAGNOSIS — Z720 Tobacco use: Secondary | ICD-10-CM

## 2023-09-08 DIAGNOSIS — Z1231 Encounter for screening mammogram for malignant neoplasm of breast: Secondary | ICD-10-CM

## 2023-09-08 DIAGNOSIS — F17210 Nicotine dependence, cigarettes, uncomplicated: Secondary | ICD-10-CM

## 2023-09-08 DIAGNOSIS — E782 Mixed hyperlipidemia: Secondary | ICD-10-CM

## 2023-09-08 DIAGNOSIS — Z1211 Encounter for screening for malignant neoplasm of colon: Secondary | ICD-10-CM

## 2023-09-08 DIAGNOSIS — R7309 Other abnormal glucose: Secondary | ICD-10-CM

## 2023-09-08 DIAGNOSIS — I10 Essential (primary) hypertension: Secondary | ICD-10-CM

## 2023-09-08 DIAGNOSIS — Z23 Encounter for immunization: Secondary | ICD-10-CM

## 2023-09-08 MED ORDER — AMLODIPINE BESYLATE 5 MG PO TABS
5.0000 mg | ORAL_TABLET | Freq: Every day | ORAL | 1 refills | Status: DC
  Filled 2023-09-08: qty 90, 90d supply, fill #0
  Filled 2023-12-07: qty 30, 30d supply, fill #1
  Filled 2024-01-04: qty 30, 30d supply, fill #2
  Filled 2024-02-25: qty 30, 30d supply, fill #3

## 2023-09-08 MED ORDER — HYDROCHLOROTHIAZIDE 25 MG PO TABS
25.0000 mg | ORAL_TABLET | Freq: Every day | ORAL | 1 refills | Status: DC
  Filled 2023-09-08: qty 90, 90d supply, fill #0
  Filled 2023-12-07: qty 30, 30d supply, fill #1
  Filled 2024-01-04: qty 30, 30d supply, fill #2
  Filled 2024-02-25: qty 30, 30d supply, fill #3

## 2023-09-08 NOTE — Progress Notes (Signed)
New Patient Office Visit  Subjective    Patient ID: Katherine Werner female  DOB: [birth year]  Age: 58 y.o. MRN: [id number]   CC:  Reestablish care   HPI  [name] is a 58 year old last visit to our office was 2022 she is reestablishing care.  Today her blood pressure is elevated she is on no medications.  She also smokes 1 to 2 cigarettes a day.  She had 1 prior to her appointment which could affect her blood pressure will recheck.. Patient has No headache, No chest pain, No abdominal pain - No Nausea, No new weakness tingling or numbness, No Cough - shortness of breath. Current Outpatient Medications on File Prior to Visit  Medication Sig Dispense Refill   atorvastatin (LIPITOR) 40 MG tablet TAKE 1 TABLET (40 MG TOTAL) BY MOUTH DAILY. (Patient not taking: Reported on 07/23/2022) 90 tablet 0   predniSONE (DELTASONE) 20 MG tablet 2 tabs po daily x 4 days 8 tablet 0   [DISCONTINUED] cetirizine (ZYRTEC) 10 MG tablet Take 10 mg by mouth daily as needed for allergies.     [DISCONTINUED] dicyclomine (BENTYL) 20 MG tablet Take 1 tablet (20 mg total) by mouth 2 (two) times daily. 20 tablet 0   [DISCONTINUED] gemfibrozil (LOPID) 600 MG tablet Take 1 tablet (600 mg total) by mouth 2 (two) times daily before a meal. 60 tablet 2   [DISCONTINUED] omeprazole (PRILOSEC) 20 MG capsule Take 1 capsule (20 mg total) by mouth daily. 30 capsule 0   No current facility-administered medications on file prior to visit.     No Known Allergies  Past Medical History:  Diagnosis Date   High cholesterol    on meds     Past Surgical History:  Procedure Laterality Date   TUBAL LIGATION  2000     Family History  Problem Relation Age of Onset   Diabetes Mother    Colon cancer Neg Hx    Esophageal cancer Neg Hx    Stomach cancer Neg Hx    Rectal cancer Neg Hx     Social History   Socioeconomic History   Marital status: Single    Spouse name: Not on file   Number of children: Not on file    Years of education: Not on file   Highest education level: 11th grade  Occupational History   Not on file  Tobacco Use   Smoking status: Every Day    Current packs/day: 0.10    Types: Cigarettes   Smokeless tobacco: Never  Vaping Use   Vaping status: Never Used  Substance and Sexual Activity   Alcohol use: Yes    Alcohol/week: 3.0 standard drinks of alcohol    Types: 3 Standard drinks or equivalent per week    Comment: occ   Drug use: Never   Sexual activity: Not Currently    Birth control/protection: Surgical  Other Topics Concern   Not on file  Social History Narrative   Not on file   Social Drivers of Health   Financial Resource Strain: Not on file  Food Insecurity: Not on file  Transportation Needs: Unmet Transportation Needs (03/22/2020)   PRAPARE - Administrator, Civil Service (Medical): Yes    Lack of Transportation (Non-Medical): Yes  Physical Activity: Not on file  Stress: Not on file  Social Connections: Not on file  Intimate Partner Violence: Not on file   Health Maintenance  Topic Date Due   Pneumococcal Vaccination (1 of 2 -  PCV) Never done   Zoster (Shingles) Vaccine (1 of 2) Never done   Stool Blood Test  04/07/2018   Mammogram  03/22/2022   Pap with HPV screening  10/10/2022   Flu Shot  02/26/2023   COVID-19 Vaccine (1 - 2024-25 season) Never done   DTaP/Tdap/Td vaccine (3 - Td or Tdap) 04/07/2027   Colon Cancer Screening  08/06/2031   Hepatitis C Screening  Completed   HIV Screening  Completed   HPV Vaccine  Aged Out    Objective     LMP 04/11/2013  BP Readings from Last 3 Encounters:  07/23/22 132/70  07/09/22 (!) 140/72  06/19/22 136/87   Physical exam: General: Vital signs reviewed.  Patient is well-developed and well-nourished, female in no acute distress and cooperative with exam. Head: Normocephalic and atraumatic. Eyes: EOMI, conjunctivae normal, no scleral icterus. Neck: Supple, trachea midline, normal ROM, no JVD,  masses, thyromegaly, or carotid bruit present. Cardiovascular: RRR, S1 normal, S2 normal, no murmurs, gallops, or rubs. Pulmonary/Chest: Clear to auscultation bilaterally, no wheezes, rales, or rhonchi. Abdominal: Soft, non-tender, non-distended, BS +, no masses, organomegaly, or guarding present. Musculoskeletal: No joint deformities, erythema, or stiffness, ROM full and nontender. Extremities: No lower extremity edema bilaterally,  pulses symmetric and intact bilaterally. No cyanosis or clubbing. Neurological: A&O x3, Strength is normal Skin: Warm, dry and intact. No rashes or erythema. Psychiatric: Normal mood and affect. speech and behavior is normal. Cognition and memory are normal.   Assessment & Plan:  Diagnoses and all orders for this visit:  Screening for colon cancer -     Cologuard  Encounter for screening mammogram for malignant neoplasm of breast -     MM 3D SCREENING MAMMOGRAM BILATERAL BREAST; Future  Mixed hyperlipidemia -     Lipid panel  Elevated glucose -     CBC with Differential/Platelet -     Hemoglobin A1c  Essential hypertension BP goal - < 130/80 Explained that having normal blood pressure is the goal and medications are helping to get to goal and maintain normal blood pressure. DIET: Limit salt intake, read nutrition labels to check salt content, limit fried and high fatty foods  Avoid using multisymptom OTC cold preparations that generally contain sudafed which can rise BP. Consult with pharmacist on best cold relief products to use for persons with HTN EXERCISE Discussed incorporating exercise such as walking - 30 minutes most days of the week and can do in 10 minute intervals    -     CMP14+EGFR  Tobacco abuse  Other orders -     amLODipine (NORVASC) 5 MG tablet; Take 1 tablet (5 mg total) by mouth daily. -     hydrochlorothiazide (HYDRODIURIL) 25 MG tablet; Take 1 tablet (25 mg total) by mouth daily.    Follow-up:  Return in about 3 months  (around 12/06/2023).  The above assessment and management plan was discussed with the patient. The patient verbalized understanding of and has agreed to the management plan. Patient is aware to call the clinic if symptoms fail to improve or worsen. Patient is aware when to return to the clinic for a follow-up visit. Patient educated on when it is appropriate to go to the emergency department.   Gwinda Passe, NP-C

## 2023-09-09 ENCOUNTER — Other Ambulatory Visit (INDEPENDENT_AMBULATORY_CARE_PROVIDER_SITE_OTHER): Payer: Self-pay | Admitting: Primary Care

## 2023-09-09 ENCOUNTER — Other Ambulatory Visit: Payer: Self-pay

## 2023-09-09 LAB — CBC WITH DIFFERENTIAL/PLATELET
Basophils Absolute: 0 10*3/uL (ref 0.0–0.2)
Basos: 1 %
EOS (ABSOLUTE): 0.2 10*3/uL (ref 0.0–0.4)
Eos: 4 %
Hematocrit: 35.3 % (ref 34.0–46.6)
Hemoglobin: 11.1 g/dL (ref 11.1–15.9)
Immature Grans (Abs): 0 10*3/uL (ref 0.0–0.1)
Immature Granulocytes: 0 %
Lymphocytes Absolute: 2.7 10*3/uL (ref 0.7–3.1)
Lymphs: 51 %
MCH: 25.1 pg — ABNORMAL LOW (ref 26.6–33.0)
MCHC: 31.4 g/dL — ABNORMAL LOW (ref 31.5–35.7)
MCV: 80 fL (ref 79–97)
Monocytes Absolute: 0.5 10*3/uL (ref 0.1–0.9)
Monocytes: 9 %
Neutrophils Absolute: 1.9 10*3/uL (ref 1.4–7.0)
Neutrophils: 35 %
Platelets: 235 10*3/uL (ref 150–450)
RBC: 4.43 x10E6/uL (ref 3.77–5.28)
RDW: 15.3 % (ref 11.7–15.4)
WBC: 5.3 10*3/uL (ref 3.4–10.8)

## 2023-09-09 LAB — LIPID PANEL
Chol/HDL Ratio: 4 {ratio} (ref 0.0–4.4)
Cholesterol, Total: 206 mg/dL — ABNORMAL HIGH (ref 100–199)
HDL: 51 mg/dL (ref >39)
LDL Chol Calc (NIH): 92 mg/dL (ref 0–99)
Triglycerides: 382 mg/dL — ABNORMAL HIGH (ref 0–149)
VLDL Cholesterol Cal: 63 mg/dL — ABNORMAL HIGH (ref 5–40)

## 2023-09-09 LAB — CMP14+EGFR
ALT: 7 [IU]/L (ref 0–32)
AST: 15 [IU]/L (ref 0–40)
Albumin: 4 g/dL (ref 3.8–4.9)
Alkaline Phosphatase: 91 [IU]/L (ref 44–121)
BUN/Creatinine Ratio: 13 (ref 9–23)
BUN: 8 mg/dL (ref 6–24)
Bilirubin Total: <0.2 mg/dL (ref 0.0–1.2)
CO2: 21 mmol/L (ref 20–29)
Calcium: 9 mg/dL (ref 8.7–10.2)
Chloride: 102 mmol/L (ref 96–106)
Creatinine, Ser: 0.63 mg/dL (ref 0.57–1.00)
Globulin, Total: 3.2 g/dL (ref 1.5–4.5)
Glucose: 82 mg/dL (ref 70–99)
Potassium: 4.5 mmol/L (ref 3.5–5.2)
Sodium: 139 mmol/L (ref 134–144)
Total Protein: 7.2 g/dL (ref 6.0–8.5)
eGFR: 102 mL/min/{1.73_m2} (ref >59)

## 2023-09-09 LAB — HEMOGLOBIN A1C
Est. average glucose Bld gHb Est-mCnc: 117 mg/dL
Hgb A1c MFr Bld: 5.7 % — ABNORMAL HIGH (ref 4.8–5.6)

## 2023-09-09 MED ORDER — ERGOCALCIFEROL 1.25 MG (50000 UT) PO CAPS
50000.0000 [IU] | ORAL_CAPSULE | ORAL | 0 refills | Status: DC
  Filled 2023-09-09: qty 8, 56d supply, fill #0

## 2023-09-09 MED ORDER — GEMFIBROZIL 600 MG PO TABS
600.0000 mg | ORAL_TABLET | Freq: Two times a day (BID) | ORAL | 1 refills | Status: DC
  Filled 2023-09-09 – 2023-09-10 (×2): qty 180, 90d supply, fill #0
  Filled 2023-12-07: qty 60, 30d supply, fill #1

## 2023-09-09 NOTE — Patient Instructions (Signed)
Pneumococcal Conjugate Vaccine: What You Need to Know Many vaccine information statements are available in Spanish and other languages. See PromoAge.com.br. 1. Why get vaccinated? Pneumococcal conjugate vaccine can prevent pneumococcal disease. Pneumococcal disease refers to any illness caused by pneumococcal bacteria. These bacteria can cause many types of illnesses, including pneumonia, which is an infection of the lungs. Pneumococcal bacteria are one of the most common causes of pneumonia. Besides pneumonia, pneumococcal bacteria can also cause: Ear infections Sinus infections Meningitis (infection of the tissue covering the brain and spinal cord) Bacteremia (infection of the blood) Anyone can get pneumococcal disease, but children under 10 years old, people with certain medical conditions or other risk factors, and adults 65 years or older are at the highest risk. Most pneumococcal infections are mild. However, some can result in long-term problems, such as brain damage or hearing loss. Meningitis, bacteremia, and pneumonia caused by pneumococcal disease can be fatal. 2. Pneumococcal conjugate vaccine Pneumococcal conjugate vaccine helps protect against bacteria that cause pneumococcal disease. There are three pneumococcal conjugate vaccines (PCV13, PCV15, and PCV20). The different vaccines are recommended for different people based on age and medical status. Your health care provider can help you determine which type of pneumococcal conjugate vaccine, and how many doses, you should receive. Infants and young children usually need 4 doses of pneumococcal conjugate vaccine. These doses are recommended at 2, 4, 6, and 27-54 months of age. Older children and adolescents might need pneumococcal conjugate vaccine depending on their age and medical conditions or other risk factors if they did not receive the recommended doses as infants or young children. Adults 19 through 27 years old with certain  medical conditions or other risk factors who have not already received pneumococcal conjugate vaccine should receive pneumococcal conjugate vaccine. Adults 65 years or older who have not previously received pneumococcal conjugate vaccine should receive pneumococcal conjugate vaccine. Some people with certain medical conditions are also recommended to receive pneumococcal polysaccharide vaccine (a different type of pneumococcal vaccine known as PPSV23). Some adults who have previously received a pneumococcal conjugate vaccine may be recommended to receive another pneumococcal conjugate vaccine. 3. Talk with your health care provider Tell your vaccination provider if the person getting the vaccine: Has had an allergic reaction after a previous dose of any type of pneumococcal conjugate vaccine (PCV13, PCV15, PCV20, or an earlier pneumococcal conjugate vaccine known as PCV7), or to any vaccine containing diphtheria toxoid (for example, DTaP), or has any severe, life-threatening allergies In some cases, your health care provider may decide to postpone pneumococcal conjugate vaccination until a future visit. People with minor illnesses, such as a cold, may be vaccinated. People who are moderately or severely ill should usually wait until they recover. Your health care provider can give you more information. 4. Risks of a vaccine reaction Redness, swelling, pain, or tenderness where the shot is given, and fever, loss of appetite, fussiness (irritability), feeling tired, headache, muscle aches, joint pain, and chills can happen after pneumococcal conjugate vaccination. Young children may be at increased risk for seizures caused by fever after a pneumococcal conjugate vaccine if it is administered at the same time as inactivated influenza vaccine. Ask your health care provider for more information. People sometimes faint after medical procedures, including vaccination. Tell your provider if you feel dizzy or  have vision changes or ringing in the ears. As with any medicine, there is a very remote chance of a vaccine causing a severe allergic reaction, other serious injury, or death. 5.  What if there is a serious problem? An allergic reaction could occur after the vaccinated person leaves the clinic. If you see signs of a severe allergic reaction (hives, swelling of the face and throat, difficulty breathing, a fast heartbeat, dizziness, or weakness), call 9-1-1 and get the person to the nearest hospital. For other signs that concern you, call your health care provider. Adverse reactions should be reported to the Vaccine Adverse Event Reporting System (VAERS). Your health care provider will usually file this report, or you can do it yourself. Visit the VAERS website at www.vaers.LAgents.no or call (936)386-8569. VAERS is only for reporting reactions, and VAERS staff members do not give medical advice. 6. The National Vaccine Injury Compensation Program The [employer] Vaccine Injury Compensation Program (VICP) is a federal program that was created to compensate people who may have been injured by certain vaccines. Claims regarding alleged injury or death due to vaccination have a time limit for filing, which may be as short as two years. Visit the VICP website at SpiritualWord.at or call 985-842-2365 to learn about the program and about filing a claim. 7. How can I learn more? Ask your health care provider. Call your local or state health department. Visit the website of the Food and Drug Administration (FDA) for vaccine package inserts and additional information at FinderList.no. Contact the Centers for Disease Control and Prevention (CDC): Call 803-607-0692 (1-800-CDC-INFO) or Visit CDC's website at PicCapture.uy. Source: CDC Vaccine Information Statement (Interim) Pneumococcal Conjugate Vaccine (12/06/2021) This same material is available at  FootballExhibition.com.br for no charge. This information is not intended to replace advice given to you by your health care provider. Make sure you discuss any questions you have with your health care provider. Document Revised: 10/29/2022 Document Reviewed: 08/04/2022 Elsevier Patient Education  2024 Elsevier Inc.  Influenza Vaccine Injection What is this medication? INFLUENZA VACCINE (in floo EN zuh vak SEEN) reduces the risk of the influenza (flu). It does not treat influenza. It is still possible to get influenza after receiving this vaccine, but the symptoms may be less severe or not last as long. It works by helping your immune system learn how to fight off a future infection. This medicine may be used for other purposes; ask your health care provider or pharmacist if you have questions. COMMON BRAND NAME(S): Afluria Trivalent, FLUAD Trivalent, Fluarix Trivalent, Flublok Trivalent, FLUCELVAX Trivalent, Flulaval Trivalent, Fluzone Trivalent What should I tell my care team before I take this medication? They need to know if you have any of these conditions: Bleeding disorder like hemophilia Fever or infection Guillain-Barre syndrome or other neurological problems Immune system problems Infection with the human immunodeficiency virus (HIV) or AIDS Low blood platelet counts Multiple sclerosis An unusual or allergic reaction to influenza virus vaccine, latex, other medications, foods, dyes, or preservatives. Different brands of vaccines contain different allergens. Some may contain latex or eggs. Talk to your care team about your allergies to make sure that you get the right vaccine. Pregnant or trying to get pregnant Breastfeeding How should I use this medication? This vaccine is injected into a muscle or under the skin. It is given by your care team. A copy of Vaccine Information Statements will be given before each vaccination. Be sure to read this sheet carefully each time. This sheet may change  often. Talk to your care team to see which vaccines are right for you. Some vaccines should not be used in all age groups. Overdosage: If you think you have taken too much  of this medicine contact a poison control center or emergency room at once. NOTE: This medicine is only for you. Do not share this medicine with others. What if I miss a dose? This does not apply. What may interact with this medication? Certain medications that lower your immune system, such as etanercept, anakinra, infliximab, adalimumab Certain medications that prevent or treat blood clots, such as warfarin Chemotherapy or radiation therapy Phenytoin Steroid medications, such as prednisone or cortisone Theophylline Vaccines This list may not describe all possible interactions. Give your health care provider a list of all the medicines, herbs, non-prescription drugs, or dietary supplements you use. Also tell them if you smoke, drink alcohol, or use illegal drugs. Some items may interact with your medicine. What should I watch for while using this medication? Report any side effects that do not go away with your care team. Call your care team if any unusual symptoms occur within 6 weeks of receiving this vaccine. You may still catch the flu, but the illness is not usually as bad. You cannot get the flu from the vaccine. The vaccine will not protect against colds or other illnesses that may cause fever. The vaccine is needed every year. What side effects may I notice from receiving this medication? Side effects that you should report to your care team as soon as possible: Allergic reactions--skin rash, itching, hives, swelling of the face, lips, tongue, or throat Side effects that usually do not require medical attention (report these to your care team if they continue or are bothersome): Chills Fatigue Headache Joint pain Loss of appetite Muscle pain Nausea Pain, redness, or irritation at injection site This list may  not describe all possible side effects. Call your doctor for medical advice about side effects. You may report side effects to FDA at 1-800-FDA-1088. Where should I keep my medication? The vaccine is only given by your care team. It will not be stored at home. NOTE: This sheet is a summary. It may not cover all possible information. If you have questions about this medicine, talk to your doctor, pharmacist, or health care provider.  2024 Elsevier/Gold Standard (2021-12-24 00:00:00)

## 2023-09-10 ENCOUNTER — Other Ambulatory Visit: Payer: Self-pay

## 2023-09-11 ENCOUNTER — Other Ambulatory Visit: Payer: Self-pay

## 2023-09-14 ENCOUNTER — Telehealth (INDEPENDENT_AMBULATORY_CARE_PROVIDER_SITE_OTHER): Payer: Self-pay | Admitting: Primary Care

## 2023-09-14 NOTE — Telephone Encounter (Signed)
Per provider asked if I could reach out to pt and schedule an nurse visit for a BP check.

## 2023-10-01 ENCOUNTER — Encounter (INDEPENDENT_AMBULATORY_CARE_PROVIDER_SITE_OTHER): Payer: Self-pay | Admitting: *Deleted

## 2023-10-06 ENCOUNTER — Ambulatory Visit (INDEPENDENT_AMBULATORY_CARE_PROVIDER_SITE_OTHER): Payer: No Typology Code available for payment source

## 2023-12-01 ENCOUNTER — Telehealth (INDEPENDENT_AMBULATORY_CARE_PROVIDER_SITE_OTHER): Payer: Self-pay | Admitting: Primary Care

## 2023-12-01 NOTE — Telephone Encounter (Signed)
 Left VM with pt about their upcoming appt.

## 2023-12-07 ENCOUNTER — Encounter (INDEPENDENT_AMBULATORY_CARE_PROVIDER_SITE_OTHER): Payer: Self-pay | Admitting: Primary Care

## 2023-12-07 ENCOUNTER — Other Ambulatory Visit: Payer: Self-pay

## 2023-12-07 ENCOUNTER — Ambulatory Visit (INDEPENDENT_AMBULATORY_CARE_PROVIDER_SITE_OTHER): Payer: Self-pay | Admitting: Primary Care

## 2023-12-07 VITALS — BP 150/78 | HR 63 | Resp 16 | Wt 137.2 lb

## 2023-12-07 DIAGNOSIS — I10 Essential (primary) hypertension: Secondary | ICD-10-CM

## 2023-12-07 DIAGNOSIS — Z1211 Encounter for screening for malignant neoplasm of colon: Secondary | ICD-10-CM

## 2023-12-07 DIAGNOSIS — R7309 Other abnormal glucose: Secondary | ICD-10-CM

## 2023-12-07 DIAGNOSIS — E782 Mixed hyperlipidemia: Secondary | ICD-10-CM

## 2023-12-07 DIAGNOSIS — Z2821 Immunization not carried out because of patient refusal: Secondary | ICD-10-CM

## 2023-12-07 NOTE — Progress Notes (Signed)
 [facility]  Katherine Werner, is a 58 y.o. female  [id number]  [id number]  DOB - [birth year]  Chief Complaint  Patient presents with   Hypertension    Didn't take medication this morning    Prediabetes       Subjective:   Katherine Werner is a 58 y.o. female here today for a follow up visit HTN- Did not take medication today. Patient has No headache, No chest pain, No abdominal pain - No Nausea, No new weakness tingling or numbness, No Cough - shortness of breath   No problems updated.  Comprehensive ROS Pertinent positive and negative noted in HPI   No Known Allergies  Past Medical History:  Diagnosis Date   High cholesterol    on meds    Current Outpatient Medications on File Prior to Visit  Medication Sig Dispense Refill   amLODipine  (NORVASC ) 5 MG tablet Take 1 tablet (5 mg total) by mouth daily. 90 tablet 1   gemfibrozil  (LOPID ) 600 MG tablet Take 1 tablet (600 mg total) by mouth 2 (two) times daily before a meal. 180 tablet 1   hydrochlorothiazide  (HYDRODIURIL ) 25 MG tablet Take 1 tablet (25 mg total) by mouth daily. 90 tablet 1   predniSONE  (DELTASONE ) 20 MG tablet 2 tabs po daily x 4 days 8 tablet 0   [DISCONTINUED] cetirizine (ZYRTEC) 10 MG tablet Take 10 mg by mouth daily as needed for allergies.     [DISCONTINUED] dicyclomine  (BENTYL ) 20 MG tablet Take 1 tablet (20 mg total) by mouth 2 (two) times daily. 20 tablet 0   [DISCONTINUED] omeprazole  (PRILOSEC) 20 MG capsule Take 1 capsule (20 mg total) by mouth daily. 30 capsule 0   No current facility-administered medications on file prior to visit.   Health Maintenance  Topic Date Due   Stool Blood Test  04/07/2018   Mammogram  03/22/2022   Pap with HPV screening  10/10/2022   COVID-19 Vaccine (1 - 2024-25 season) Never done   Zoster (Shingles) Vaccine (1 of 2) 03/08/2024*   Flu Shot  02/26/2024   DTaP/Tdap/Td vaccine (3 - Td or Tdap) 04/07/2027   Colon Cancer Screening  08/06/2031    Pneumococcal Vaccination  Completed   Hepatitis C Screening  Completed   HIV Screening  Completed   HPV Vaccine  Aged Out   Meningitis B Vaccine  Aged Out  *Topic was postponed. The date shown is not the original due date.    Objective:   Vitals:   12/07/23 1035 12/07/23 1036 12/07/23 1124  BP: (!) 167/83 (!) 174/84 (!) 150/78  Pulse: 63    Resp: 16    SpO2: 98%    Weight: 137 lb 3.2 oz (62.2 kg)     BP Readings from Last 3 Encounters:  12/07/23 (!) 150/78  09/09/23 (!) 181/77  07/23/22 132/70      Physical Exam Vitals reviewed.  Constitutional:      Appearance: Normal appearance.  HENT:     Head: Normocephalic.     Right Ear: Tympanic membrane, ear canal and external ear normal.     Left Ear: Tympanic membrane, ear canal and external ear normal.     Nose: Nose normal.     Mouth/Throat:     Mouth: Mucous membranes are moist.  Eyes:     Extraocular Movements: Extraocular movements intact.     Pupils: Pupils are equal, round, and reactive to light.  Cardiovascular:     Rate and Rhythm: Normal rate.  Pulmonary:  Effort: Pulmonary effort is normal.     Breath sounds: Normal breath sounds.  Abdominal:     General: Bowel sounds are normal.     Palpations: Abdomen is soft.  Musculoskeletal:        General: Normal range of motion.     Cervical back: Normal range of motion.  Skin:    General: Skin is warm and dry.  Neurological:     Mental Status: She is alert and oriented to person, place, and time.  Psychiatric:        Mood and Affect: Mood normal.        Behavior: Behavior normal.        Thought Content: Thought content normal.       Assessment & Plan   Sharlyne was seen today for hypertension and prediabetes.  Diagnoses and all orders for this visit:  Essential hypertension BP goal - < 130/80 Explained that having normal blood pressure is the goal and medications are helping to get to goal and maintain normal blood pressure. DIET: Limit salt intake,  read nutrition labels to check salt content, limit fried and high fatty foods  Avoid using multisymptom OTC cold preparations that generally contain sudafed which can rise BP. Consult with pharmacist on best cold relief products to use for persons with HTN EXERCISE Discussed incorporating exercise such as walking - 30 minutes most days of the week and can do in 10 minute intervals    -     CMP14+EGFR  Herpes zoster vaccination declined  Mixed hyperlipidemia -     Lipid panel  Elevated glucose -     CBC with Differential/Platelet  Screening for colon cancer [facility]T    Patient have been counseled extensively about nutrition and exercise. Other issues discussed during this visit include: low cholesterol diet, weight control and daily exercise, foot care, annual eye examinations at Ophthalmology, importance of adherence with medications and regular follow-up. We also discussed long term complications of uncontrolled diabetes and hypertension.   Return in about 3 months (around 03/08/2024).  The patient was given clear instructions to go to ER or return to medical center if symptoms don't improve, worsen or new problems develop. The patient verbalized understanding. The patient was told to call to get lab results if they haven't heard anything in the next week.   This note has been created with Education officer, environmental. Any transcriptional errors are unintentional.   Marius Siemens, NP 12/07/2023, 11:27 AM

## 2023-12-08 ENCOUNTER — Other Ambulatory Visit: Payer: Self-pay

## 2023-12-08 LAB — CMP14+EGFR
ALT: 9 IU/L (ref 0–32)
AST: 26 IU/L (ref 0–40)
Albumin: 4.6 g/dL (ref 3.8–4.9)
Alkaline Phosphatase: 99 IU/L (ref 44–121)
BUN/Creatinine Ratio: 12 (ref 9–23)
BUN: 8 mg/dL (ref 6–24)
Bilirubin Total: 0.3 mg/dL (ref 0.0–1.2)
CO2: 24 mmol/L (ref 20–29)
Calcium: 10.2 mg/dL (ref 8.7–10.2)
Chloride: 93 mmol/L — ABNORMAL LOW (ref 96–106)
Creatinine, Ser: 0.68 mg/dL (ref 0.57–1.00)
Globulin, Total: 3.7 g/dL (ref 1.5–4.5)
Glucose: 96 mg/dL (ref 70–99)
Potassium: 4.6 mmol/L (ref 3.5–5.2)
Sodium: 135 mmol/L (ref 134–144)
Total Protein: 8.3 g/dL (ref 6.0–8.5)
eGFR: 100 mL/min/{1.73_m2} (ref >59)

## 2023-12-08 LAB — LIPID PANEL
Chol/HDL Ratio: 3.4 ratio (ref 0.0–4.4)
Cholesterol, Total: 271 mg/dL — ABNORMAL HIGH (ref 100–199)
HDL: 79 mg/dL (ref >39)
LDL Chol Calc (NIH): 176 mg/dL — ABNORMAL HIGH (ref 0–99)
Triglycerides: 93 mg/dL (ref 0–149)
VLDL Cholesterol Cal: 16 mg/dL (ref 5–40)

## 2023-12-08 LAB — CBC WITH DIFFERENTIAL/PLATELET
Basophils Absolute: 0.1 10*3/uL (ref 0.0–0.2)
Basos: 1 %
EOS (ABSOLUTE): 0.2 10*3/uL (ref 0.0–0.4)
Eos: 3 %
Hematocrit: 37.9 % (ref 34.0–46.6)
Hemoglobin: 12.2 g/dL (ref 11.1–15.9)
Immature Grans (Abs): 0 10*3/uL (ref 0.0–0.1)
Immature Granulocytes: 0 %
Lymphocytes Absolute: 2.3 10*3/uL (ref 0.7–3.1)
Lymphs: 42 %
MCH: 25.2 pg — ABNORMAL LOW (ref 26.6–33.0)
MCHC: 32.2 g/dL (ref 31.5–35.7)
MCV: 78 fL — ABNORMAL LOW (ref 79–97)
Monocytes Absolute: 0.4 10*3/uL (ref 0.1–0.9)
Monocytes: 8 %
Neutrophils Absolute: 2.5 10*3/uL (ref 1.4–7.0)
Neutrophils: 46 %
Platelets: 317 10*3/uL (ref 150–450)
RBC: 4.85 x10E6/uL (ref 3.77–5.28)
RDW: 14.8 % (ref 11.7–15.4)
WBC: 5.5 10*3/uL (ref 3.4–10.8)

## 2023-12-09 ENCOUNTER — Telehealth: Payer: Self-pay

## 2023-12-09 NOTE — Telephone Encounter (Signed)
 Telephoned patient at mobile number. Left a voice message with [facility] (scholarship) contact information.

## 2023-12-10 ENCOUNTER — Other Ambulatory Visit (INDEPENDENT_AMBULATORY_CARE_PROVIDER_SITE_OTHER): Payer: Self-pay | Admitting: Primary Care

## 2023-12-10 ENCOUNTER — Ambulatory Visit (INDEPENDENT_AMBULATORY_CARE_PROVIDER_SITE_OTHER): Payer: Self-pay | Admitting: Primary Care

## 2023-12-10 DIAGNOSIS — E782 Mixed hyperlipidemia: Secondary | ICD-10-CM

## 2023-12-10 MED ORDER — PRAVASTATIN SODIUM 40 MG PO TABS
40.0000 mg | ORAL_TABLET | Freq: Every day | ORAL | 1 refills | Status: DC
  Filled 2023-12-10: qty 90, 90d supply, fill #0
  Filled 2024-02-25: qty 90, 90d supply, fill #1

## 2023-12-11 ENCOUNTER — Other Ambulatory Visit: Payer: Self-pay

## 2023-12-11 NOTE — Telephone Encounter (Signed)
 Telephoned patient at mobile number. Left a voice message with [facility] (scholarship) contact information.

## 2023-12-14 ENCOUNTER — Other Ambulatory Visit: Payer: Self-pay

## 2023-12-24 ENCOUNTER — Telehealth (INDEPENDENT_AMBULATORY_CARE_PROVIDER_SITE_OTHER): Payer: Self-pay | Admitting: *Deleted

## 2023-12-24 DIAGNOSIS — Z1231 Encounter for screening mammogram for malignant neoplasm of breast: Secondary | ICD-10-CM

## 2023-12-24 NOTE — Telephone Encounter (Signed)
 Left message on voicemail to return call.  Schedule appt for screening mammogram at [facility]

## 2023-12-28 ENCOUNTER — Encounter: Payer: Self-pay | Admitting: Primary Care

## 2024-01-01 ENCOUNTER — Encounter (INDEPENDENT_AMBULATORY_CARE_PROVIDER_SITE_OTHER): Payer: Self-pay

## 2024-01-02 ENCOUNTER — Encounter (HOSPITAL_COMMUNITY): Payer: Self-pay | Admitting: Emergency Medicine

## 2024-01-02 ENCOUNTER — Emergency Department (HOSPITAL_COMMUNITY): Payer: Self-pay

## 2024-01-02 ENCOUNTER — Other Ambulatory Visit: Payer: Self-pay

## 2024-01-02 ENCOUNTER — Emergency Department (HOSPITAL_COMMUNITY)
Admission: EM | Admit: 2024-01-02 | Discharge: 2024-01-02 | Disposition: A | Discharge status: Home / Self Care | Payer: Self-pay | Attending: Emergency Medicine | Admitting: Emergency Medicine

## 2024-01-02 DIAGNOSIS — Z79899 Other long term (current) drug therapy: Secondary | ICD-10-CM | POA: Insufficient documentation

## 2024-01-02 DIAGNOSIS — R1013 Epigastric pain: Secondary | ICD-10-CM

## 2024-01-02 DIAGNOSIS — R1031 Right lower quadrant pain: Secondary | ICD-10-CM | POA: Insufficient documentation

## 2024-01-02 DIAGNOSIS — I10 Essential (primary) hypertension: Secondary | ICD-10-CM | POA: Insufficient documentation

## 2024-01-02 DIAGNOSIS — R1011 Right upper quadrant pain: Secondary | ICD-10-CM | POA: Insufficient documentation

## 2024-01-02 DIAGNOSIS — R6883 Chills (without fever): Secondary | ICD-10-CM | POA: Insufficient documentation

## 2024-01-02 DIAGNOSIS — R350 Frequency of micturition: Secondary | ICD-10-CM | POA: Insufficient documentation

## 2024-01-02 DIAGNOSIS — E876 Hypokalemia: Secondary | ICD-10-CM

## 2024-01-02 DIAGNOSIS — R1084 Generalized abdominal pain: Secondary | ICD-10-CM | POA: Insufficient documentation

## 2024-01-02 LAB — CBC WITH DIFFERENTIAL/PLATELET
Abs Immature Granulocytes: 0.01 10*3/uL (ref 0.00–0.07)
Basophils Absolute: 0 10*3/uL (ref 0.0–0.1)
Basophils Relative: 1 %
Eosinophils Absolute: 0.2 10*3/uL (ref 0.0–0.5)
Eosinophils Relative: 3 %
HCT: 34.9 % — ABNORMAL LOW (ref 36.0–46.0)
Hemoglobin: 11.1 g/dL — ABNORMAL LOW (ref 12.0–15.0)
Immature Granulocytes: 0 %
Lymphocytes Relative: 47 %
Lymphs Abs: 3 10*3/uL (ref 0.7–4.0)
MCH: 24.4 pg — ABNORMAL LOW (ref 26.0–34.0)
MCHC: 31.8 g/dL (ref 30.0–36.0)
MCV: 76.9 fL — ABNORMAL LOW (ref 80.0–100.0)
Monocytes Absolute: 0.5 10*3/uL (ref 0.1–1.0)
Monocytes Relative: 7 %
Neutro Abs: 2.7 10*3/uL (ref 1.7–7.7)
Neutrophils Relative %: 42 %
Platelets: 281 10*3/uL (ref 150–400)
RBC: 4.54 MIL/uL (ref 3.87–5.11)
RDW: 13.4 % (ref 11.5–15.5)
WBC: 6.4 10*3/uL (ref 4.0–10.5)
nRBC: 0 % (ref 0.0–0.2)

## 2024-01-02 LAB — COMPREHENSIVE METABOLIC PANEL WITH GFR
ALT: 13 U/L (ref 0–44)
AST: 31 U/L (ref 15–41)
Albumin: 3.6 g/dL (ref 3.5–5.0)
Alkaline Phosphatase: 66 U/L (ref 38–126)
Anion gap: 12 (ref 5–15)
BUN: 9 mg/dL (ref 6–20)
CO2: 21 mmol/L — ABNORMAL LOW (ref 22–32)
Calcium: 8.8 mg/dL — ABNORMAL LOW (ref 8.9–10.3)
Chloride: 97 mmol/L — ABNORMAL LOW (ref 98–111)
Creatinine, Ser: 0.51 mg/dL (ref 0.44–1.00)
GFR, Estimated: >60 mL/min (ref >60)
Glucose, Bld: 97 mg/dL (ref 70–99)
Potassium: 2.9 mmol/L — ABNORMAL LOW (ref 3.5–5.1)
Sodium: 130 mmol/L — ABNORMAL LOW (ref 135–145)
Total Bilirubin: 0.6 mg/dL (ref 0.0–1.2)
Total Protein: 8.4 g/dL — ABNORMAL HIGH (ref 6.5–8.1)

## 2024-01-02 LAB — URINALYSIS, ROUTINE W REFLEX MICROSCOPIC
Bilirubin Urine: NEGATIVE
Glucose, UA: NEGATIVE mg/dL
Hgb urine dipstick: NEGATIVE
Ketones, ur: NEGATIVE mg/dL
Nitrite: NEGATIVE
Protein, ur: NEGATIVE mg/dL
Specific Gravity, Urine: 1.003 — ABNORMAL LOW (ref 1.005–1.030)
pH: 6 (ref 5.0–8.0)

## 2024-01-02 LAB — LIPASE, BLOOD: Lipase: 44 U/L (ref 11–51)

## 2024-01-02 LAB — TROPONIN I (HIGH SENSITIVITY)
Troponin I (High Sensitivity): 4 ng/L (ref <18)
Troponin I (High Sensitivity): 6 ng/L (ref <18)

## 2024-01-02 MED ORDER — POTASSIUM CHLORIDE 10 MEQ/100ML IV SOLN
10.0000 meq | INTRAVENOUS | Status: AC
  Administered 2024-01-02 (×2): 10 meq via INTRAVENOUS
  Filled 2024-01-02 (×2): qty 100

## 2024-01-02 MED ORDER — POTASSIUM CHLORIDE CRYS ER 20 MEQ PO TBCR
40.0000 meq | EXTENDED_RELEASE_TABLET | Freq: Once | ORAL | Status: AC
  Administered 2024-01-02: 40 meq via ORAL
  Filled 2024-01-02: qty 2

## 2024-01-02 MED ORDER — PANTOPRAZOLE SODIUM 20 MG PO TBEC
20.0000 mg | DELAYED_RELEASE_TABLET | Freq: Every day | ORAL | 0 refills | Status: DC
  Filled 2024-01-02: qty 30, 30d supply, fill #0

## 2024-01-02 MED ORDER — IOHEXOL 350 MG/ML SOLN
75.0000 mL | Freq: Once | INTRAVENOUS | Status: AC | PRN
  Administered 2024-01-02: 75 mL via INTRAVENOUS

## 2024-01-02 MED ORDER — SUCRALFATE 1 G PO TABS
1.0000 g | ORAL_TABLET | Freq: Three times a day (TID) | ORAL | 0 refills | Status: DC
  Filled 2024-01-02: qty 30, 8d supply, fill #0

## 2024-01-02 MED ORDER — LACTATED RINGERS IV BOLUS
1000.0000 mL | Freq: Once | INTRAVENOUS | Status: AC
  Administered 2024-01-02: 1000 mL via INTRAVENOUS

## 2024-01-02 NOTE — ED Provider Notes (Signed)
 Received signout from previous provider, please see her note for complete H&P.  This is a 58 year old female presenting with complaints of vague upper abdominal discomfort ongoing for the past 2 to 3 days.  Increasing pain while trying to sleep.  EKG shows ST depression in V1-V4 similar to prior EKG.  No other significant changes noted.  Labs obtained independent viewed interpreted by me which is remarkable for hypokalemia with a potassium of 2.9, patient was given supplementation.  Urine did not show any signs of urinary tract infection, she has normal WBC, her hemoglobin is 11.1.  She has normal lipase and normal troponin.  CT scan of her abdomen pelvis obtained independent viewed interpreted by me which shows no acute finding except for small hiatal hernia.  Agree with radiologist interpretation.  Chest x-ray shows low lung volumes with central airway thickening compatible with bronchitis or reactive airway disease.  Patient currently resting comfortably appears to be in no acute discomfort.  She has received her potassium supplementation.  Encourage patient to follow-up outpatient for further care.  Return precaution given.  Will also give referral to cardiology.  ED ECG REPORT   Date: 01/02/2024  Rate: 56  Rhythm: sinus bradycardia  QRS Axis: normal  Intervals: normal  ST/T Wave abnormalities: nonspecific T wave changes  Conduction Disutrbances:none  Narrative Interpretation:   Old EKG Reviewed: unchanged  I have personally reviewed the EKG tracing and agree with the computerized printout as noted.  BP 136/83 (BP Location: Left Arm)   Pulse (!) 59   Temp 97.8 F (36.6 C)   Resp 18   Ht 5\' 4"  (1.626 m)   Wt 63.5 kg   LMP 04/11/2013   SpO2 100%   BMI 24.03 kg/m   Results for orders placed or performed during the hospital encounter of 01/02/24  CBC with Differential   Collection Time: 01/02/24  4:38 AM  Result Value Ref Range   WBC 6.4 4.0 - 10.5 K/uL   RBC 4.54 3.87 - 5.11  MIL/uL   Hemoglobin 11.1 (L) 12.0 - 15.0 g/dL   HCT 60.4 (L) 54.0 - 98.1 %   MCV 76.9 (L) 80.0 - 100.0 fL   MCH 24.4 (L) 26.0 - 34.0 pg   MCHC 31.8 30.0 - 36.0 g/dL   RDW 19.1 47.8 - 29.5 %   Platelets 281 150 - 400 K/uL   nRBC 0.0 0.0 - 0.2 %   Neutrophils Relative % 42 %   Neutro Abs 2.7 1.7 - 7.7 K/uL   Lymphocytes Relative 47 %   Lymphs Abs 3.0 0.7 - 4.0 K/uL   Monocytes Relative 7 %   Monocytes Absolute 0.5 0.1 - 1.0 K/uL   Eosinophils Relative 3 %   Eosinophils Absolute 0.2 0.0 - 0.5 K/uL   Basophils Relative 1 %   Basophils Absolute 0.0 0.0 - 0.1 K/uL   Immature Granulocytes 0 %   Abs Immature Granulocytes 0.01 0.00 - 0.07 K/uL  Comprehensive metabolic panel   Collection Time: 01/02/24  4:38 AM  Result Value Ref Range   Sodium 130 (L) 135 - 145 mmol/L   Potassium 2.9 (L) 3.5 - 5.1 mmol/L   Chloride 97 (L) 98 - 111 mmol/L   CO2 21 (L) 22 - 32 mmol/L   Glucose, Bld 97 70 - 99 mg/dL   BUN 9 6 - 20 mg/dL   Creatinine, Ser 6.21 0.44 - 1.00 mg/dL   Calcium  8.8 (L) 8.9 - 10.3 mg/dL   Total Protein 8.4 (H)  6.5 - 8.1 g/dL   Albumin 3.6 3.5 - 5.0 g/dL   AST 31 15 - 41 U/L   ALT 13 0 - 44 U/L   Alkaline Phosphatase 66 38 - 126 U/L   Total Bilirubin 0.6 0.0 - 1.2 mg/dL   GFR, Estimated >57 >84 mL/min   Anion gap 12 5 - 15  Lipase, blood   Collection Time: 01/02/24  4:38 AM  Result Value Ref Range   Lipase 44 11 - 51 U/L  Troponin I (High Sensitivity)   Collection Time: 01/02/24  4:38 AM  Result Value Ref Range   Troponin I (High Sensitivity) 4 <18 ng/L  Urinalysis, Routine w reflex microscopic -Urine, Clean Catch   Collection Time: 01/02/24  6:17 AM  Result Value Ref Range   Color, Urine STRAW (A) YELLOW   APPearance CLEAR CLEAR   Specific Gravity, Urine 1.003 (L) 1.005 - 1.030   pH 6.0 5.0 - 8.0   Glucose, UA NEGATIVE NEGATIVE mg/dL   Hgb urine dipstick NEGATIVE NEGATIVE   Bilirubin Urine NEGATIVE NEGATIVE   Ketones, ur NEGATIVE NEGATIVE mg/dL   Protein, ur  NEGATIVE NEGATIVE mg/dL   Nitrite NEGATIVE NEGATIVE   Leukocytes,Ua TRACE (A) NEGATIVE   RBC / HPF 0-5 0 - 5 RBC/hpf   WBC, UA 0-5 0 - 5 WBC/hpf   Bacteria, UA RARE (A) NONE SEEN   Squamous Epithelial / HPF 0-5 0 - 5 /HPF  Troponin I (High Sensitivity)   Collection Time: 01/02/24  6:17 AM  Result Value Ref Range   Troponin I (High Sensitivity) 6 <18 ng/L   CT ABDOMEN PELVIS W CONTRAST Result Date: 01/02/2024 CLINICAL DATA:  Right lower quadrant abdominal pain. EXAM: CT ABDOMEN AND PELVIS WITH CONTRAST TECHNIQUE: Multidetector CT imaging of the abdomen and pelvis was performed using the standard protocol following bolus administration of intravenous contrast. RADIATION DOSE REDUCTION: This exam was performed according to the departmental dose-optimization program which includes automated exposure control, adjustment of the mA and/or kV according to patient size and/or use of iterative reconstruction technique. CONTRAST:  75mL OMNIPAQUE IOHEXOL 350 MG/ML SOLN COMPARISON:  03/08/2004 FINDINGS: Lower chest: No acute abnormality. Hepatobiliary: No focal liver abnormality is seen. No gallstones, gallbladder wall thickening, or biliary dilatation. Pancreas: Unremarkable. No pancreatic ductal dilatation or surrounding inflammatory changes. Spleen: Normal in size without focal abnormality. Adrenals/Urinary Tract: Adrenal glands are unremarkable. Kidneys are normal, without renal calculi, focal lesion, or hydronephrosis. Bladder is unremarkable. Stomach/Bowel: Small hiatal hernia. The appendix is visualized and appears normal. No pathologic dilatation of the large or small bowel loops. Intramural fatty deposition is identified within the ascending colon. No significant bowel wall thickening or inflammation identified. Vascular/Lymphatic: Aortic atherosclerosis, mild. No aneurysm. No signs of abdominopelvic adenopathy. Reproductive: Uterus and bilateral adnexa are unremarkable. Other: No free fluid or fluid  collections. Musculoskeletal: Signs of avascular necrosis within the left scratch set signs suggestive of early AVN within the left hip. No acute or suspicious osseous findings. IMPRESSION: 1. No acute findings within the abdomen or pelvis. 2. Normal appendix. 3. Small hiatal hernia. 4. 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 5.  Aortic Atherosclerosis (ICD10-I70.0). Electronically Signed   By: Kimberley Penman M.D.   On: 01/02/2024 07:17   DG Chest 2 View Result Date: 01/02/2024 CLINICAL DATA:  Epigastric pain. EXAM: CHEST - 2 VIEW COMPARISON:  07/09/2022 FINDINGS: Cardiac enlargement is stable. Lung volumes are low with mild asymmetric elevation of right hemidiaphragm. No pleural fluid, interstitial edema,  or airspace consolidation. Central airway thickening identified. Visualized osseous structures are intact. IMPRESSION: 1. Low lung volumes. 2. Central airway thickening compatible with bronchitis or reactive airways disease. Electronically Signed   By: Kimberley Penman M.D.   On: 01/02/2024 05:22        Debbra Fairy, PA-C 01/02/24 1021    Tegeler, Marine Sia, MD 01/02/24 [phone]

## 2024-01-02 NOTE — ED Notes (Signed)
 Patient discharged by RN. Patient ambulatory to lobby at time of discharge with no additional questions.

## 2024-01-02 NOTE — Discharge Instructions (Addendum)
 You have been evaluated for your symptoms.  Fortunately no concerning findings were noted on today's exam.  Your potassium level is a bit low, we have given you potassium supplementation in the ER please have it rechecked by your primary care doctor for further care.  You should also follow-up with cardiology office for outpatient evaluation of your symptoms to ensure this is not related to your heart.  Please return to ER if you have any concern.

## 2024-01-02 NOTE — ED Triage Notes (Signed)
 BIB [facility] pt c/o abd pain that started 2-3 days ago. Pt states that she feels like her abd is tight across her upper stomach and across her back. Pt states that she has been unable to sleep.   BP 142/88 HR 88  Spo2 99

## 2024-01-02 NOTE — ED Provider Notes (Signed)
 [place] [facility] Provider Note   CSN: [id number] Arrival date & time: 01/02/24  0418     History  Chief Complaint  Patient presents with   Abdominal Pain    Katherine Werner is a 58 y.o. female who is a very poor historian who presents with concern for 2 or 3 days of vague abdominal tightness across the upper abdomen that radiates into her back.  States this is disrupting her sleep.  It is very difficult to discern if patient has identified any alleviating or aggravating factors.  Patient denies nausea vomiting but endorses soft stools for the last 3 days, endorses decreased urinary frequency and volume of output without dysuria or urgency.  No fevers but some chills today. HTN  hyperlipidemia.   HPI     Home Medications Prior to Admission medications   Medication Sig Start Date End Date Taking? Authorizing Provider  amLODipine  (NORVASC ) 5 MG tablet Take 1 tablet (5 mg total) by mouth daily. 09/08/23   Marius Siemens, NP  hydrochlorothiazide  (HYDRODIURIL ) 25 MG tablet Take 1 tablet (25 mg total) by mouth daily. 09/08/23   Marius Siemens, NP  pravastatin  (PRAVACHOL ) 40 MG tablet Take 1 tablet (40 mg total) by mouth daily. 12/10/23   Marius Siemens, NP  predniSONE  (DELTASONE ) 20 MG tablet 2 tabs po daily x 4 days 06/19/22   Mesner, Jason, MD  cetirizine (ZYRTEC) 10 MG tablet Take 10 mg by mouth daily as needed for allergies.  08/03/19  [provider]  dicyclomine  (BENTYL ) 20 MG tablet Take 1 tablet (20 mg total) by mouth 2 (two) times daily. 11/29/14 08/03/19  Palumbo, April, MD  omeprazole  (PRILOSEC) 20 MG capsule Take 1 capsule (20 mg total) by mouth daily. 11/29/14 08/03/19  Palumbo, April, MD      Allergies    Patient has no known allergies.    Review of Systems   Review of Systems  Constitutional: Negative.   HENT: Negative.    Respiratory:  Positive for chest tightness and shortness of breath. Negative for wheezing.    Cardiovascular: Negative.  Negative for chest pain.  Gastrointestinal:  Positive for abdominal pain and diarrhea.  Musculoskeletal:  Positive for back pain.    Physical Exam Updated Vital Signs BP (!) 140/79 (BP Location: Left Arm)   Pulse 65   Temp 98.1 F (36.7 C) (Oral)   Resp 17   Ht 5\' 4"  (1.626 m)   Wt 63.5 kg   LMP 04/11/2013   SpO2 100%   BMI 24.03 kg/m  Physical Exam Vitals and nursing note reviewed.  Constitutional:      Appearance: She is ill-appearing. She is not toxic-appearing.  HENT:     Head: Normocephalic and atraumatic.     Mouth/Throat:     Mouth: Mucous membranes are moist.     Pharynx: No oropharyngeal exudate or posterior oropharyngeal erythema.  Eyes:     General:        Right eye: No discharge.        Left eye: No discharge.     Conjunctiva/sclera: Conjunctivae normal.  Cardiovascular:     Rate and Rhythm: Normal rate and regular rhythm.     Pulses: Normal pulses.     Heart sounds: No murmur heard. Pulmonary:     Effort: Pulmonary effort is normal. No respiratory distress.     Breath sounds: Normal breath sounds. No wheezing or rales.  Abdominal:     General: Bowel  sounds are normal. There is no distension.     Palpations: Abdomen is soft.     Tenderness: There is generalized abdominal tenderness and tenderness in the right upper quadrant and right lower quadrant. There is no right CVA tenderness, left CVA tenderness, guarding or rebound.  Musculoskeletal:        General: No deformity.     Cervical back: Neck supple.  Skin:    General: Skin is warm and dry.  Neurological:     Mental Status: She is alert. Mental status is at baseline.  Psychiatric:        Mood and Affect: Mood normal.     ED Results / Procedures / Treatments   Labs (all labs ordered are listed, but only abnormal results are displayed) Labs Reviewed  CBC WITH DIFFERENTIAL/PLATELET - Abnormal; Notable for the following components:      Result Value   Hemoglobin 11.1  (*)    HCT 34.9 (*)    MCV 76.9 (*)    MCH 24.4 (*)    All other components within normal limits  COMPREHENSIVE METABOLIC PANEL WITH GFR - Abnormal; Notable for the following components:   Sodium 130 (*)    Potassium 2.9 (*)    Chloride 97 (*)    CO2 21 (*)    Calcium  8.8 (*)    Total Protein 8.4 (*)    All other components within normal limits  LIPASE, BLOOD  URINALYSIS, ROUTINE W REFLEX MICROSCOPIC  TROPONIN I (HIGH SENSITIVITY)  TROPONIN I (HIGH SENSITIVITY)    EKG None  Radiology DG Chest 2 View Result Date: 01/02/2024 CLINICAL DATA:  Epigastric pain. EXAM: CHEST - 2 VIEW COMPARISON:  07/09/2022 FINDINGS: Cardiac enlargement is stable. Lung volumes are low with mild asymmetric elevation of right hemidiaphragm. No pleural fluid, interstitial edema, or airspace consolidation. Central airway thickening identified. Visualized osseous structures are intact. IMPRESSION: 1. Low lung volumes. 2. Central airway thickening compatible with bronchitis or reactive airways disease. Electronically Signed   By: Kimberley Penman M.D.   On: 01/02/2024 05:22    Procedures Procedures    Medications Ordered in ED Medications  potassium chloride SA (KLOR-CON M) CR tablet 40 mEq (has no administration in time range)  potassium chloride 10 mEq in 100 mL IVPB (has no administration in time range)  lactated ringers bolus 1,000 mL (has no administration in time range)    ED Course/ Medical Decision Making/ A&P                                 Medical Decision Making 58 y/o female who presents with concern for abdominal and back pain for a few days.   Mildly HTN on intake, VS otherwise normal. Cardiopulmonary and abdominal exams are benign.   The differential diagnosis for RUQ includes but is not limited to:  Cholelithiasis / choledocholithiasis / cholecystitis / cholangitis, hepatitis (eg. viral, alcoholic, toxic),liver abscess, pancreatitis, liver / pancreatic / biliary tract cancer, ischemic  hepatopathy (shock liver), hepatic vein obstruction (Budd-Chiari syndrome), liver cell adenoma, peptic ulcer disease (duodenal), functional or nonulcer dyspepsia, right lower lobe pneumonia, pyelonephritis, urinary calculi,  Fitz-Hugh-Curtis syndrome (with pelvic inflammatory disease), herpes zoster, trauma or musculoskeletal pain, herniated disk, abdominal abscess, intestinal ischemia, physical or sexual abuse, ectopic pregnancy, IUP, Mittelschmerz, ovarian cyst/torsion, threatened/ievitable abortion, PID, endometriosis, molar pregnancy, heterotopic pregnancy, corpus luteum cyst, appendicitis, UTI/renal colic, IBD.    Amount and/or Complexity of Data Reviewed  Labs: ordered. Radiology: ordered.  Risk Prescription drug management.  Care of this patient signed out to oncoming ED provider B. Garland Junk, PA-C at time of shift change. All pertinent HPI, physical exam, and laboratory findings were discussed with them prior to my departure. Disposition of patient pending completion of workup, reevaluation, and clinical judgement of oncoming ED provider.   Shiesha voiced understanding of her medical evaluation and treatment plan. Each of their questions answered to their expressed satisfaction.  She is amenable to plan for CT imaging at this time.   This chart was dictated using voice recognition software, Dragon. Despite the best efforts of this provider to proofread and correct errors, errors may still occur which can change documentation meaning.         Final Clinical Impression(s) / ED Diagnoses Final diagnoses:  None    Rx / DC Orders ED Discharge Orders     None         Arlyne Lame 01/02/24 1610    Eldon Greenland, MD 01/02/24 [phone]

## 2024-01-04 ENCOUNTER — Other Ambulatory Visit: Payer: Self-pay

## 2024-01-04 ENCOUNTER — Other Ambulatory Visit (HOSPITAL_COMMUNITY): Payer: Self-pay

## 2024-01-15 ENCOUNTER — Other Ambulatory Visit: Payer: Self-pay

## 2024-01-15 ENCOUNTER — Other Ambulatory Visit (INDEPENDENT_AMBULATORY_CARE_PROVIDER_SITE_OTHER): Payer: Self-pay | Admitting: Primary Care

## 2024-01-15 NOTE — Telephone Encounter (Signed)
 Will forward to provider

## 2024-01-21 ENCOUNTER — Other Ambulatory Visit (INDEPENDENT_AMBULATORY_CARE_PROVIDER_SITE_OTHER): Payer: Self-pay | Admitting: Primary Care

## 2024-01-21 ENCOUNTER — Other Ambulatory Visit: Payer: Self-pay

## 2024-01-21 ENCOUNTER — Telehealth (INDEPENDENT_AMBULATORY_CARE_PROVIDER_SITE_OTHER): Payer: Self-pay

## 2024-01-21 MED ORDER — SUCRALFATE 1 G PO TABS
1.0000 g | ORAL_TABLET | Freq: Three times a day (TID) | ORAL | 0 refills | Status: DC
  Filled 2024-01-21: qty 30, 8d supply, fill #0

## 2024-01-21 NOTE — Telephone Encounter (Signed)
 Contacted pt to schedule mammogram pt didn't answer lvm

## 2024-01-21 NOTE — Telephone Encounter (Signed)
 Requested medication (s) are due for refill today: yes  Requested medication (s) are on the active medication list: yes  Last refill:  01/02/24  Future visit scheduled: no  Notes to clinic:  Unable to refill per protocol, last refill by another/ ED provider. Routing to PCP for approval.     Requested Prescriptions  Pending Prescriptions Disp Refills   sucralfate  (CARAFATE ) 1 g tablet 30 tablet 0    Sig: Take 1 tablet (1 g total) by mouth 4 (four) times daily -  with meals and at bedtime.     Gastroenterology: Antiacids Passed - 01/21/2024  8:24 AM      Passed - Valid encounter within last 12 months    Recent Outpatient Visits           1 month ago Essential hypertension   [facility] Celestia Rosaline SQUIBB, NP   4 months ago Screening for colon cancer   [place] [facility] Celestia Rosaline SQUIBB, NP   2 years ago Routine physical examination   [place] [facility] Oley Bascom RAMAN, NP   3 years ago Hypertriglyceridemia   [place] [facility] Celestia Rosaline SQUIBB, NP   4 years ago Cervical cancer screening   [place] [facility] Celestia Rosaline SQUIBB, NP

## 2024-01-22 ENCOUNTER — Other Ambulatory Visit: Payer: Self-pay

## 2024-01-28 ENCOUNTER — Other Ambulatory Visit: Payer: Self-pay

## 2024-02-04 ENCOUNTER — Telehealth (INDEPENDENT_AMBULATORY_CARE_PROVIDER_SITE_OTHER): Payer: Self-pay

## 2024-02-04 NOTE — Telephone Encounter (Signed)
 Contacted pt to schedule mammogram  Pt is schedule for 03/04/24 at 11:20am

## 2024-02-10 ENCOUNTER — Other Ambulatory Visit: Payer: Self-pay | Admitting: Primary Care

## 2024-02-10 DIAGNOSIS — Z1231 Encounter for screening mammogram for malignant neoplasm of breast: Secondary | ICD-10-CM

## 2024-02-25 ENCOUNTER — Other Ambulatory Visit: Payer: Self-pay

## 2024-02-25 ENCOUNTER — Other Ambulatory Visit (HOSPITAL_COMMUNITY): Payer: Self-pay

## 2024-02-25 ENCOUNTER — Other Ambulatory Visit (INDEPENDENT_AMBULATORY_CARE_PROVIDER_SITE_OTHER): Payer: Self-pay | Admitting: Primary Care

## 2024-02-25 MED ORDER — SUCRALFATE 1 G PO TABS
1.0000 g | ORAL_TABLET | Freq: Three times a day (TID) | ORAL | 0 refills | Status: DC
  Filled 2024-02-25: qty 30, 8d supply, fill #0

## 2024-02-25 MED ORDER — PANTOPRAZOLE SODIUM 20 MG PO TBEC
20.0000 mg | DELAYED_RELEASE_TABLET | Freq: Every day | ORAL | 0 refills | Status: DC
  Filled 2024-02-25: qty 30, 30d supply, fill #0

## 2024-02-25 NOTE — Telephone Encounter (Signed)
 Will forward to provider

## 2024-02-26 ENCOUNTER — Other Ambulatory Visit: Payer: Self-pay

## 2024-02-29 ENCOUNTER — Other Ambulatory Visit: Payer: Self-pay

## 2024-03-04 ENCOUNTER — Ambulatory Visit
Admission: RE | Admit: 2024-03-04 | Discharge: 2024-03-04 | Disposition: A | Discharge status: Home / Self Care | Source: Ambulatory Visit | Attending: Primary Care | Admitting: Primary Care

## 2024-03-04 DIAGNOSIS — Z1231 Encounter for screening mammogram for malignant neoplasm of breast: Secondary | ICD-10-CM

## 2024-03-11 ENCOUNTER — Encounter (INDEPENDENT_AMBULATORY_CARE_PROVIDER_SITE_OTHER): Payer: Self-pay | Admitting: Primary Care

## 2024-03-11 ENCOUNTER — Ambulatory Visit (INDEPENDENT_AMBULATORY_CARE_PROVIDER_SITE_OTHER): Payer: Self-pay | Admitting: Primary Care

## 2024-03-11 ENCOUNTER — Other Ambulatory Visit: Payer: Self-pay

## 2024-03-11 VITALS — BP 138/72 | HR 59 | Ht 64.0 in | Wt 140.0 lb

## 2024-03-11 DIAGNOSIS — E782 Mixed hyperlipidemia: Secondary | ICD-10-CM

## 2024-03-11 DIAGNOSIS — Z1211 Encounter for screening for malignant neoplasm of colon: Secondary | ICD-10-CM

## 2024-03-11 DIAGNOSIS — K219 Gastro-esophageal reflux disease without esophagitis: Secondary | ICD-10-CM

## 2024-03-11 DIAGNOSIS — I10 Essential (primary) hypertension: Secondary | ICD-10-CM

## 2024-03-11 DIAGNOSIS — Z720 Tobacco use: Secondary | ICD-10-CM

## 2024-03-11 MED ORDER — PANTOPRAZOLE SODIUM 20 MG PO TBEC
20.0000 mg | DELAYED_RELEASE_TABLET | Freq: Every day | ORAL | 0 refills | Status: DC
  Filled 2024-03-11: qty 30, 30d supply, fill #0

## 2024-03-11 MED ORDER — SUCRALFATE 1 G PO TABS
1.0000 g | ORAL_TABLET | Freq: Two times a day (BID) | ORAL | 1 refills | Status: DC
  Filled 2024-03-11: qty 90, 45d supply, fill #0
  Filled 2024-05-05: qty 90, 45d supply, fill #1

## 2024-03-11 MED ORDER — PRAVASTATIN SODIUM 40 MG PO TABS
40.0000 mg | ORAL_TABLET | Freq: Every day | ORAL | 1 refills | Status: AC
  Filled 2024-03-11 – 2024-06-02 (×2): qty 90, 90d supply, fill #0

## 2024-03-11 MED ORDER — AMLODIPINE BESYLATE 5 MG PO TABS
5.0000 mg | ORAL_TABLET | Freq: Every day | ORAL | 1 refills | Status: AC
  Filled 2024-03-11: qty 90, 90d supply, fill #0
  Filled 2024-04-07: qty 30, 30d supply, fill #0
  Filled 2024-05-05: qty 30, 30d supply, fill #1
  Filled 2024-06-02: qty 30, 30d supply, fill #2
  Filled 2024-06-30: qty 30, 30d supply, fill #3
  Filled 2024-08-01: qty 30, 30d supply, fill #4
  Filled 2024-09-01: qty 30, 30d supply, fill #5

## 2024-03-11 MED ORDER — HYDROCHLOROTHIAZIDE 25 MG PO TABS
25.0000 mg | ORAL_TABLET | Freq: Every day | ORAL | 1 refills | Status: AC
  Filled 2024-03-11: qty 90, 90d supply, fill #0
  Filled 2024-04-07: qty 30, 30d supply, fill #0
  Filled 2024-05-05: qty 30, 30d supply, fill #1
  Filled 2024-06-02: qty 30, 30d supply, fill #2
  Filled 2024-06-30: qty 30, 30d supply, fill #3
  Filled 2024-08-01: qty 30, 30d supply, fill #4
  Filled 2024-09-01: qty 30, 30d supply, fill #5

## 2024-03-11 NOTE — Progress Notes (Unsigned)
 [facility]  Katherine Werner, is a 58 y.o. female  RDW:[id number]  FMW:[id number]  DOB - 1966-01-13  Chief Complaint  Patient presents with   Medical Management of Chronic Issues    No concerns       Subjective:   Katherine Werner is a 58 y.o. female here today for a follow up visit. Patient has No headache, No chest pain, No abdominal pain - No Nausea, No new weakness tingling or numbness, No Cough - shortness of breath HPI  No problems updated.  Comprehensive ROS Pertinent positive and negative noted in HPI   No Known Allergies  Past Medical History:  Diagnosis Date   High cholesterol    on meds    Current Outpatient Medications on File Prior to Visit  Medication Sig Dispense Refill   [DISCONTINUED] cetirizine (ZYRTEC) 10 MG tablet Take 10 mg by mouth daily as needed for allergies.     [DISCONTINUED] dicyclomine (BENTYL) 20 MG tablet Take 1 tablet (20 mg total) by mouth 2 (two) times daily. 20 tablet 0   [DISCONTINUED] omeprazole (PRILOSEC) 20 MG capsule Take 1 capsule (20 mg total) by mouth daily. 30 capsule 0   No current facility-administered medications on file prior to visit.   Health Maintenance  Topic Date Due   Hepatitis B Vaccine (1 of 3 - 19+ 3-dose series) Never done   Zoster (Shingles) Vaccine (1 of 2) Never done   Stool Blood Test  08/05/2022   Pap with HPV screening  10/10/2022   COVID-19 Vaccine (1 - 2024-25 season) Never done   Flu Shot  02/26/2024   Mammogram  03/04/2026   DTaP/Tdap/Td vaccine (3 - Td or Tdap) 04/07/2027   Colon Cancer Screening  08/06/2031   Pneumococcal Vaccine for age over 44  Completed   Hepatitis C Screening  Completed   HIV Screening  Completed   HPV Vaccine  Aged Out   Meningitis B Vaccine  Aged Out   Pneumococcal Vaccine  Discontinued    Objective:   Vitals:   03/11/24 1000  BP: 138/72  Pulse: (!) 59  SpO2: 100%  Weight: 140 lb (63.5 kg)  Height: 5' 4 (1.626 m)   {Vitals History  (Optional):23777}  Physical Exam  {Labs (Optional):23779}  Assessment & Plan  Essential hypertension -     amLODIPine Besylate; Take 1 tablet (5 mg total) by mouth daily.  Dispense: 90 tablet; Refill: 1 -     hydroCHLOROthiazide; Take 1 tablet (25 mg total) by mouth daily.  Dispense: 90 tablet; Refill: 1  Mixed hyperlipidemia -     Pravastatin Sodium; Take 1 tablet (40 mg total) by mouth daily.  Dispense: 90 tablet; Refill: 1 -     Lipid panel; Future  Gastroesophageal reflux disease without esophagitis -     Sucralfate; Take 1 tablet (1 g total) by mouth 2 (two) times daily.  Dispense: 90 tablet; Refill: 1  Colon cancer screening  Tobacco abuse     Patient have been counseled extensively about nutrition and exercise. Other issues discussed during this visit include: low cholesterol diet, weight control and daily exercise, foot care, annual eye examinations at Ophthalmology, importance of adherence with medications and regular follow-up. We also discussed long term complications of uncontrolled diabetes and hypertension.   Return in about 3 months (around 06/11/2024) for fasting labs.  The patient was given clear instructions to go to ER or return to medical center if symptoms don't improve, worsen or new problems develop. The patient verbalized  understanding. The patient was told to call to get lab results if they haven't heard anything in the next week.   This note has been created with Education officer, environmental. Any transcriptional errors are unintentional.   Katherine SHAUNNA Bohr, NP 03/11/2024, 11:15 AM

## 2024-03-16 ENCOUNTER — Other Ambulatory Visit: Payer: Self-pay

## 2024-03-18 ENCOUNTER — Other Ambulatory Visit: Payer: Self-pay

## 2024-04-07 ENCOUNTER — Other Ambulatory Visit: Payer: Self-pay

## 2024-04-11 ENCOUNTER — Other Ambulatory Visit: Payer: Self-pay

## 2024-05-05 ENCOUNTER — Other Ambulatory Visit: Payer: Self-pay

## 2024-05-06 ENCOUNTER — Other Ambulatory Visit: Payer: Self-pay

## 2024-06-02 ENCOUNTER — Other Ambulatory Visit: Payer: Self-pay

## 2024-06-02 ENCOUNTER — Other Ambulatory Visit (INDEPENDENT_AMBULATORY_CARE_PROVIDER_SITE_OTHER): Payer: Self-pay | Admitting: Primary Care

## 2024-06-02 DIAGNOSIS — K219 Gastro-esophageal reflux disease without esophagitis: Secondary | ICD-10-CM

## 2024-06-03 ENCOUNTER — Other Ambulatory Visit: Payer: Self-pay

## 2024-06-03 MED ORDER — SUCRALFATE 1 G PO TABS
1.0000 g | ORAL_TABLET | Freq: Two times a day (BID) | ORAL | 1 refills | Status: AC
  Filled 2024-06-03 – 2024-06-30 (×2): qty 90, 45d supply, fill #0
  Filled 2024-08-11: qty 90, 45d supply, fill #1

## 2024-06-03 NOTE — Telephone Encounter (Signed)
 Will forward to provider

## 2024-06-16 ENCOUNTER — Other Ambulatory Visit: Payer: Self-pay

## 2024-06-30 ENCOUNTER — Other Ambulatory Visit: Payer: Self-pay

## 2024-07-01 ENCOUNTER — Other Ambulatory Visit: Payer: Self-pay

## 2024-08-01 ENCOUNTER — Other Ambulatory Visit: Payer: Self-pay

## 2024-08-11 ENCOUNTER — Other Ambulatory Visit: Payer: Self-pay

## 2024-09-01 ENCOUNTER — Other Ambulatory Visit: Payer: Self-pay

## 2024-09-02 ENCOUNTER — Other Ambulatory Visit: Payer: Self-pay
